# Patient Record
Sex: Female | Born: 1940 | Race: White | Hispanic: No | Smoking: Current every day smoker
Health system: Southern US, Community
[De-identification: ages and names within clinical notes are randomized; demographics above are authoritative.]

## PROBLEM LIST (undated history)

## (undated) DIAGNOSIS — J189 Pneumonia, unspecified organism: Secondary | ICD-10-CM

## (undated) DIAGNOSIS — Z720 Tobacco use: Secondary | ICD-10-CM

## (undated) DIAGNOSIS — Z8701 Personal history of pneumonia (recurrent): Secondary | ICD-10-CM

## (undated) DIAGNOSIS — Z9889 Other specified postprocedural states: Secondary | ICD-10-CM

## (undated) DIAGNOSIS — F419 Anxiety disorder, unspecified: Secondary | ICD-10-CM

## (undated) DIAGNOSIS — R112 Nausea with vomiting, unspecified: Secondary | ICD-10-CM

## (undated) DIAGNOSIS — C50919 Malignant neoplasm of unspecified site of unspecified female breast: Secondary | ICD-10-CM

## (undated) DIAGNOSIS — R911 Solitary pulmonary nodule: Secondary | ICD-10-CM

## (undated) DIAGNOSIS — N200 Calculus of kidney: Secondary | ICD-10-CM

## (undated) HISTORY — DX: Calculus of kidney: N20.0

## (undated) HISTORY — DX: Malignant neoplasm of unspecified site of unspecified female breast: C50.919

## (undated) HISTORY — PX: JOINT REPLACEMENT: SHX530

## (undated) HISTORY — PX: BREAST SURGERY: SHX581

## (undated) HISTORY — DX: Personal history of pneumonia (recurrent): Z87.01

## (undated) HISTORY — DX: Solitary pulmonary nodule: R91.1

## (undated) HISTORY — PX: PARTIAL HYSTERECTOMY: SHX80

## (undated) HISTORY — DX: Tobacco use: Z72.0

## (undated) HISTORY — PX: TUBAL LIGATION: SHX77

---

## 1998-06-14 ENCOUNTER — Ambulatory Visit (HOSPITAL_COMMUNITY): Admission: RE | Admit: 1998-06-14 | Discharge: 1998-06-14 | Payer: Self-pay | Admitting: Otolaryngology

## 1998-07-03 ENCOUNTER — Other Ambulatory Visit: Admission: RE | Admit: 1998-07-03 | Discharge: 1998-07-03 | Payer: Self-pay | Admitting: *Deleted

## 1998-07-12 ENCOUNTER — Emergency Department (HOSPITAL_COMMUNITY): Admission: EM | Admit: 1998-07-12 | Discharge: 1998-07-12 | Payer: Self-pay | Admitting: Emergency Medicine

## 1998-07-12 ENCOUNTER — Encounter: Payer: Self-pay | Admitting: Emergency Medicine

## 1998-10-26 ENCOUNTER — Encounter: Payer: Self-pay | Admitting: Otolaryngology

## 1999-10-22 ENCOUNTER — Other Ambulatory Visit: Admission: RE | Admit: 1999-10-22 | Discharge: 1999-10-22 | Payer: Self-pay | Admitting: *Deleted

## 2000-03-10 ENCOUNTER — Encounter: Payer: Self-pay | Admitting: Emergency Medicine

## 2000-03-10 ENCOUNTER — Emergency Department (HOSPITAL_COMMUNITY): Admission: EM | Admit: 2000-03-10 | Discharge: 2000-03-10 | Payer: Self-pay | Admitting: Emergency Medicine

## 2000-11-11 ENCOUNTER — Other Ambulatory Visit: Admission: RE | Admit: 2000-11-11 | Discharge: 2000-11-11 | Payer: Self-pay | Admitting: *Deleted

## 2001-11-30 ENCOUNTER — Other Ambulatory Visit: Admission: RE | Admit: 2001-11-30 | Discharge: 2001-11-30 | Payer: Self-pay | Admitting: *Deleted

## 2003-01-11 ENCOUNTER — Other Ambulatory Visit: Admission: RE | Admit: 2003-01-11 | Discharge: 2003-01-11 | Payer: Self-pay | Admitting: *Deleted

## 2003-01-20 ENCOUNTER — Encounter: Admission: RE | Admit: 2003-01-20 | Discharge: 2003-01-20 | Payer: Self-pay | Admitting: Orthopedic Surgery

## 2003-01-20 ENCOUNTER — Encounter: Payer: Self-pay | Admitting: Orthopedic Surgery

## 2003-01-26 ENCOUNTER — Ambulatory Visit (HOSPITAL_BASED_OUTPATIENT_CLINIC_OR_DEPARTMENT_OTHER): Admission: RE | Admit: 2003-01-26 | Discharge: 2003-01-27 | Payer: Self-pay | Admitting: Orthopedic Surgery

## 2003-05-04 ENCOUNTER — Ambulatory Visit (HOSPITAL_BASED_OUTPATIENT_CLINIC_OR_DEPARTMENT_OTHER): Admission: RE | Admit: 2003-05-04 | Discharge: 2003-05-04 | Payer: Self-pay | Admitting: Orthopedic Surgery

## 2003-09-21 ENCOUNTER — Ambulatory Visit (HOSPITAL_BASED_OUTPATIENT_CLINIC_OR_DEPARTMENT_OTHER): Admission: RE | Admit: 2003-09-21 | Discharge: 2003-09-21 | Payer: Self-pay | Admitting: Orthopedic Surgery

## 2003-12-07 ENCOUNTER — Ambulatory Visit (HOSPITAL_COMMUNITY): Admission: RE | Admit: 2003-12-07 | Discharge: 2003-12-07 | Payer: Self-pay | Admitting: Internal Medicine

## 2004-01-18 ENCOUNTER — Other Ambulatory Visit: Admission: RE | Admit: 2004-01-18 | Discharge: 2004-01-18 | Payer: Self-pay | Admitting: *Deleted

## 2004-12-06 ENCOUNTER — Ambulatory Visit (HOSPITAL_COMMUNITY): Admission: RE | Admit: 2004-12-06 | Discharge: 2004-12-06 | Payer: Self-pay | Admitting: Internal Medicine

## 2005-06-04 ENCOUNTER — Ambulatory Visit (HOSPITAL_COMMUNITY): Admission: RE | Admit: 2005-06-04 | Discharge: 2005-06-04 | Payer: Self-pay | Admitting: Orthopedic Surgery

## 2005-12-09 ENCOUNTER — Other Ambulatory Visit: Admission: RE | Admit: 2005-12-09 | Discharge: 2005-12-09 | Payer: Self-pay | Admitting: Internal Medicine

## 2006-12-30 ENCOUNTER — Ambulatory Visit (HOSPITAL_COMMUNITY): Admission: RE | Admit: 2006-12-30 | Discharge: 2006-12-30 | Payer: Self-pay | Admitting: Internal Medicine

## 2007-12-27 ENCOUNTER — Other Ambulatory Visit: Admission: RE | Admit: 2007-12-27 | Discharge: 2007-12-27 | Payer: Self-pay | Admitting: Internal Medicine

## 2008-02-03 ENCOUNTER — Ambulatory Visit (HOSPITAL_COMMUNITY): Admission: RE | Admit: 2008-02-03 | Discharge: 2008-02-03 | Payer: Self-pay | Admitting: Internal Medicine

## 2008-07-04 ENCOUNTER — Encounter: Admission: RE | Admit: 2008-07-04 | Discharge: 2008-07-04 | Payer: Self-pay | Admitting: Otolaryngology

## 2009-03-01 ENCOUNTER — Emergency Department (HOSPITAL_COMMUNITY): Admission: EM | Admit: 2009-03-01 | Discharge: 2009-03-01 | Payer: Self-pay | Admitting: Emergency Medicine

## 2009-03-22 ENCOUNTER — Encounter: Payer: Self-pay | Admitting: Internal Medicine

## 2009-05-01 ENCOUNTER — Ambulatory Visit: Payer: Self-pay | Admitting: Internal Medicine

## 2009-05-01 DIAGNOSIS — R911 Solitary pulmonary nodule: Secondary | ICD-10-CM

## 2009-05-01 DIAGNOSIS — N200 Calculus of kidney: Secondary | ICD-10-CM | POA: Insufficient documentation

## 2009-05-01 DIAGNOSIS — F17200 Nicotine dependence, unspecified, uncomplicated: Secondary | ICD-10-CM

## 2009-05-04 ENCOUNTER — Encounter: Payer: Self-pay | Admitting: Internal Medicine

## 2009-05-05 DIAGNOSIS — Z8701 Personal history of pneumonia (recurrent): Secondary | ICD-10-CM | POA: Insufficient documentation

## 2009-05-05 DIAGNOSIS — J329 Chronic sinusitis, unspecified: Secondary | ICD-10-CM | POA: Insufficient documentation

## 2009-05-07 ENCOUNTER — Telehealth: Payer: Self-pay | Admitting: Internal Medicine

## 2009-05-17 ENCOUNTER — Ambulatory Visit: Payer: Self-pay | Admitting: Internal Medicine

## 2009-05-24 ENCOUNTER — Ambulatory Visit: Payer: Self-pay | Admitting: Internal Medicine

## 2009-06-20 ENCOUNTER — Encounter: Payer: Self-pay | Admitting: Internal Medicine

## 2009-10-29 ENCOUNTER — Ambulatory Visit: Payer: Self-pay | Admitting: Internal Medicine

## 2009-10-29 DIAGNOSIS — J40 Bronchitis, not specified as acute or chronic: Secondary | ICD-10-CM

## 2010-02-21 ENCOUNTER — Ambulatory Visit: Payer: Self-pay | Admitting: Internal Medicine

## 2010-06-25 ENCOUNTER — Ambulatory Visit: Payer: Self-pay | Admitting: Internal Medicine

## 2010-10-17 NOTE — Assessment & Plan Note (Signed)
Summary: 3 months/apc   Primary Provider/Referring Provider:  Dossie Arbour  CC:  3 month folllow up visit.  History of Present Illness: 05/23/09- Pulmonary nodule, Tobacco We discussed her CD of CT showing small basilar nodules. She is facing colonoscopy for recurrent right upper quadrant right flank pain. She remains frank about being a smoker x 50 years, and we discussed usual issues of tracking nodules: typically for 2 years; detail from CT vs radiation/ cost benefits of CXR.  October 29, 2009- Pulmonary nodule, Tobacco  PFT in Sept had shown mild obstruction. "Sick". She made it over a year without a sinus infection, but started one on Jan 20. Green and yellow. She increased antibiotic, Z pak and mucinex. Used neosynephrine. Less blockage/ pressure now. Pouring rhinorhea. Light headed. Chest congestion and dyspnea. Hasn't been to her ENT this time. Prednisone helped before. Caring for 20 yo mother. Doesn't get flu shot. Says she gets chills when sick, but never runs fever. Throat has been sore and glands swollen, but no nausea or mucscle aches.  February 21, 2010- Pulmonary nodule, tobacco Stressed by responsibility for mother's care. She feels used to this hot summer weather but admits it is hard on her breathing. Back pain- has appt w/ Dr Eulah Pont next week. Hx of 3 lung nodules on Urologic CT 04/2009 ( 1 RML, 2 RLL). We discussed radiation, cost and information available from CT vs CXR. We agreed to check CXr. She was concerned about the amount of radiation she has had from her other medical problems. She has not atempted to stop smoking and is not inclined to do so.  Preventive Screening-Counseling & Management  Alcohol-Tobacco     Smoking Status: current-53 years     Packs/Day: 2.0     Pack years: >50     Tobacco Counseling: to quit use of tobacco products  Current Medications (verified): 1)  Estrace 1 Mg Tabs (Estradiol) .... Take 1 Tablet By Mouth Once A Day 2)  Medroxyprogesterone  Acetate 2.5 Mg Tabs (Medroxyprogesterone Acetate) .... Take 1 Tablet By Mouth Once A Day 3)  Astelin 137 Mcg/spray Soln (Azelastine Hcl) .... As Directed 4)  Nasonex 50 Mcg/act Susp (Mometasone Furoate) .... 2 Sprays in Each Nostril Daily 5)  Tetracycline Hcl 500 Mg Caps (Tetracycline Hcl) .... Take 1 Tablet By Mouth Once A Day 6)  Zaleplon 10 Mg Caps (Zaleplon) .... Take 1 Tablet By Mouth Once A Day 7)  Finacea 15 % Gel (Azelaic Acid) .... As Directed 8)  Sudafed 30 Mg Tabs (Pseudoephedrine Hcl) .... As Directed 9)  Multivitamins  Tabs (Multiple Vitamin) .... Take 1 Tablet By Mouth Once A Day 10)  Vitamin D 1000 Unit Tabs (Cholecalciferol) .... Take 3 By Mouth Once Daily 11)  Vitamin C 500 Mg Tabs (Ascorbic Acid) .... Take 1 Tablet By Mouth Once A Day 12)  Acidophilus  Caps (Lactobacillus) .... Take 1 Tablet By Mouth Once A Day 13)  Lysine Hcl 1000 Mg Tabs (Lysine Hcl) .... Take 1 Tablet By Mouth Once A Day 14)  Fish Oil 1000 Mg Caps (Omega-3 Fatty Acids) .... Take 1 Tablet By Mouth Once A Day 15)  Clemestine Fumarate 1.34 Mg .... Take 1/2 To 1 Tablet Daily 16)  Epa 1600 .... Take 1 Tablet By Mouth Once A Day 17)  Dha 800 .... Take 1 Tablet By Mouth Once A Day 18)  Sesamin 60 .... Take 1 Tablet By Mouth Once A Day  Allergies (verified): 1)  ! Pcn 2)  !  Sulfa 3)  ! 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 -2 ppd  Risk Factors: Smoking Status: current-53 years  (02/21/2010) Packs/Day: 2.0 (02/21/2010)  Family History: Cancer Mother- ill, "failure to thrive", Hospice  Social History: Packs/Day:  2.0 Smoking Status:  current-53 years Pack years:  >50  Review of Systems      See HPI  The patient denies shortness of breath with activity, shortness of breath at rest, productive cough, non-productive cough, coughing up blood, chest pain, irregular heartbeats, acid heartburn, indigestion, loss of appetite, weight change, abdominal pain, difficulty swallowing, sore throat, tooth/dental problems, headaches, nasal congestion/difficulty breathing through nose, sneezing, itching, ear ache, anxiety, depression, hand/feet swelling, joint stiffness or pain, rash, change in color of mucus, and fever.    Vital Signs:  Patient profile:   70 year old female Height:      66 inches Weight:      140 pounds BMI:     22.68 O2 Sat:      95 % on Room air Pulse rate:   78 / minute BP sitting:   116 / 68  (left arm) Cuff size:   regular  Vitals Entered By: Reynaldo Minium CMA (February 21, 2010 3:07 PM)  O2 Flow:  Room air CC: 3 month folllow up visit   Physical Exam  Additional Exam:  General: A/Ox3; pleasant and cooperative, NAD, tall,  SKIN: no rash, lesions NODES: no lymphadenopathy HEENT: [place]/AT, EOM- WNL, Conjuctivae- clear, PERRLA, TM-WNL, Nose- clear, Throat- clear and wnl, Mallampatii II, no erythema or drip NECK: Supple w/ fair ROM, JVD- none, normal carotid impulses w/o bruits Thyroid-  CHEST: clear to P&A HEART: RRR, no m/g/r heard ABDOMEN: Soft and nl; AOZ:HYQM, nl pulses, no edema  NEURO: Grossly intact to observation      Impression & Recommendations:  Problem # 1:  PULMONARY NODULE (ICD-518.89)  We will update CXR, with comparison of imaging technologies discussed. We agreed to get CXR this time.  Problem # 2:  BRONCHITIS (ICD-490)  Asymptomatic now. She notes litle cough or phlegm. The following medications were removed from the  medication list:    Mucinex Dm 30-600 Mg Xr12h-tab (Dextromethorphan-guaifenesin) .Marland Kitchen... Take two times a day    Clarithromycin 500 Mg Tabs (Clarithromycin) .Marland Kitchen... 1 twice daily after meals Her updated medication list for this problem includes:    Tetracycline Hcl 500 Mg Caps (Tetracycline hcl) .Marland Kitchen... Take 1 tablet by mouth once a day  Problem # 3:  TOBACCO ABUSE (ICD-305.1)  Continuing counseling  Orders: Est. Patient Level III (57846) T-2 View CXR (71020TC)  Medications Added to Medication List This Visit: 1)  Vitamin D 1000 Unit Tabs (Cholecalciferol) .... Take 3 by mouth once daily  Patient Instructions: 1)  Please schedule a follow-up appointment in 4 months. 2)  A chest x-ray has been recommended.  Your imaging study may require preauthorization.  3)  Please try to minimize the smoking where you can

## 2010-10-17 NOTE — Assessment & Plan Note (Signed)
Summary: 4 month/ mbw   Primary Pollie Poma/Referring Daviyon Widmayer:  Dossie Arbour  CC:  4 month follow up visit-yesterday "fall issues" and increased meds to help.Marland Kitchen  History of Present Illness: October 29, 2009- Pulmonary nodule, Tobacco  PFT in Sept had shown mild obstruction. "Sick". She made it over a year without a sinus infection, but started one on Jan 20. Green and yellow. She increased antibiotic, Z pak and mucinex. Used neosynephrine. Less blockage/ pressure now. Pouring rhinorhea. Light headed. Chest congestion and dyspnea. Hasn't been to her ENT this time. Prednisone helped before. Caring for 64 yo mother. Doesn't get flu shot. Says she gets chills when sick, but never runs fever. Throat has been sore and glands swollen, but no nausea or mucscle aches.  February 21, 2010- Pulmonary nodule, tobacco Stressed by responsibility for mother's care. She feels used to this hot summer weather but admits it is hard on her breathing. Back pain- has appt w/ Dr Eulah Pont next week. Hx of 3 lung nodules on Urologic CT 04/2009 ( 1 RML, 2 RLL). We discussed radiation, cost and information available from CT vs CXR. We agreed to check CXr. She was concerned about the amount of radiation she has had from her other medical problems. She has not atempted to stop smoking and is not inclined to do so.  June 25, 2010- Pulmonary nodule, tobacco cc:4 month follow up visit-yesterday "fall issues" and increased meds to help. Continues to smoke.  Feels she is "on the edge" to coming down with something. Abrupt onset yesterday of head congestion and postnasal drip. she increased Sudafed and clemastine. Today feels much better. She feels the risk factor is dry indoor heat. She uses humidifers. Doesn't like Neti pot. She holds antibiotic and some prednisone. She is on Vit D. Discussed zinc.  CXR- 02/21/10- COPD, NAD.    Preventive Screening-Counseling & Management  Alcohol-Tobacco     Smoking Status: current-53 years  Packs/Day: 2.0     Pack years: >50     Tobacco Counseling: to quit use of tobacco products  Current Medications (verified): 1)  Estrace 1 Mg Tabs (Estradiol) .... Take 1 Tablet By Mouth Once A Day 2)  Medroxyprogesterone Acetate 2.5 Mg Tabs (Medroxyprogesterone Acetate) .... Take 1 Tablet By Mouth Once A Day 3)  Astelin 137 Mcg/spray Soln (Azelastine Hcl) .... As Directed 4)  Nasonex 50 Mcg/act Susp (Mometasone Furoate) .... 2 Sprays in Each Nostril Daily 5)  Tetracycline Hcl 500 Mg Caps (Tetracycline Hcl) .... Take 1 Tablet By Mouth Once A Day 6)  Zaleplon 10 Mg Caps (Zaleplon) .... Take 1 Tablet By Mouth Once A Day 7)  Finacea 15 % Gel (Azelaic Acid) .... As Directed 8)  Sudafed 30 Mg Tabs (Pseudoephedrine Hcl) .... As Directed 9)  Multivitamins  Tabs (Multiple Vitamin) .... Take 1 Tablet By Mouth Once A Day 10)  Vitamin D 1000 Unit Tabs (Cholecalciferol) .... Take 3 By Mouth Once Daily 11)  Vitamin C 500 Mg Tabs (Ascorbic Acid) .... Take 1 Tablet By Mouth Once A Day 12)  Acidophilus  Caps (Lactobacillus) .... Take 1 Tablet By Mouth Once A Day 13)  Lysine Hcl 1000 Mg Tabs (Lysine Hcl) .... Take 1 Tablet By Mouth Once A Day 14)  Clemestine Fumarate 1.34 Mg .... Take 1/2 To 1 Tablet Daily 15)  Epa 1600 .... Take 1 Tablet By Mouth Once A Day 16)  Dha 800 .... Take 1 Tablet By Mouth Once A Day 17)  Sesamin 60 .... Take  1 Tablet By Mouth Once A Day  Allergies (verified): 1)  ! Pcn 2)  ! Sulfa 3)  ! Codeine  Past History:  Past Medical History: Last updated: 05/01/2009 PULMONARY NODULE (ICD-518.89) SINUSITIS, CHRONIC (ICD-473.9) PERSONAL HISTORY PNEUMONIA RECURRENT (ICD-V12.61) TOBACCO ABUSE (ICD-305.1) NEPHROLITHIASIS (ICD-592.0)  Past Surgical History: Last updated: 05/01/2009 partial hysterectomy tubal ligation  Family History: Last updated: 02/21/2010 Cancer Mother- ill, "failure to thrive", Hospice  Social History: Last updated: 05/01/2009 Widowed, living alone Son  is anesthesiologist in [place] Patient is a current smoker. -2 ppd  Risk Factors: Smoking Status: current-53 years (06/25/2010) Packs/Day: 2.0 (06/25/2010)  Review of Systems      See HPI       The patient complains of nasal congestion/difficulty breathing through nose.  The patient denies shortness of breath with activity, shortness of breath at rest, productive cough, non-productive cough, coughing up blood, chest pain, irregular heartbeats, acid heartburn, indigestion, loss of appetite, weight change, abdominal pain, difficulty swallowing, tooth/dental problems, headaches, and sneezing.    Vital Signs:  Patient profile:   70 year old female Height:      66 inches Weight:      141 pounds BMI:     22.84 O2 Sat:      97 % on Room air Pulse rate:   90 / minute BP sitting:   122 / 74  (right arm) Cuff size:   regular  Vitals Entered By: Reynaldo Minium CMA (June 25, 2010 4:19 PM)  O2 Flow:  Room air CC: 4 month follow up visit-yesterday "fall issues" and increased meds to help.   Physical Exam  Additional Exam:  General: A/Ox3; pleasant and cooperative, NAD, tall,  SKIN: no rash, lesions NODES: no lymphadenopathy HEENT: [place]/AT, EOM- WNL, Conjuctivae- clear, PERRLA, TM-WNL, Nose- clear, Throat- clear and wnl, Mallampatii II, no erythema or drip NECK: Supple w/ fair ROM, JVD- none, normal carotid impulses w/o bruits Thyroid-  CHEST: clear to P&A HEART: RRR, no m/g/r heard ABDOMEN: Soft and nl; YNW:GNFA, nl pulses, no edema  NEURO: Grossly intact to observation      CXR  Procedure date:  02/21/2010  Findings:      DG CHEST 2 VIEW - [id number]   Clinical Data: Followup lung nodule, history of tobacco abuse   CHEST - 2 VIEW   Comparison: Chest x-ray of 05/01/2009   Findings: The lungs are hyperaerated consistent with COPD.  No definite lung nodule is noted by plain film.  Correlation with prior CT is recommended.  Mediastinal contours are stable.  The heart is  within normal limits in size.  No bony abnormality is seen.   IMPRESSION: COPD.  No active lung disease.   Read By:  Juline Patch,  M.D.     Released By:  Juline Patch,  M.D.  _______  Impression & Recommendations:  Problem # 1:  BRONCHITIS (ICD-490)  Her concern is that URIs quickly progress. Today she feel well. We discussed prophyllaxis, hydration and etc and will give the standby meds as needed. Discussed flu vax, which she doesn't take since strong flu response 40 years ago.  Her updated medication list for this problem includes:    Tetracycline Hcl 500 Mg Caps (Tetracycline hcl) .Marland Kitchen... Take 1 tablet by mouth once a day    Clarithromycin 500 Mg Tabs (Clarithromycin) .Marland Kitchen... 1 two times a day with food  Problem # 2:  PULMONARY NODULE (ICD-518.89) CXR surveillance so far is unchanged.   Problem # 3:  TOBACCO  ABUSE (ICD-305.1) She is not prepared to stop smoking despite my supportive efforts.  Medications Added to Medication List This Visit: 1)  Prednisone 10 Mg Tabs (Prednisone) .Marland Kitchen.. 1 tab four times daily x 2 days, 3 times daily x 2 days, 2 times daily x 2 days, 1 time daily x 2 days 2)  Clarithromycin 500 Mg Tabs (Clarithromycin) .Marland Kitchen.. 1 two times a day with food  Other Orders: Est. Patient Level III (75102)  Patient Instructions: 1)  Please schedule a follow-up appointment in 1 year. Call sooner if needed. 2)  Yes Ma'am, I still recommend flu shots and smoking cesation as good general strategies for a long and healthy life.  Prescriptions: CLARITHROMYCIN 500 MG TABS (CLARITHROMYCIN) 1 two times a day with food  #14 x 2   Entered and Authorized by:   Waymon Budge MD   Signed by:   Waymon Budge MD on 06/25/2010   Method used:   Historical   RxID:   [id number] PREDNISONE 10 MG TABS (PREDNISONE) 1 tab four times daily x 2 days, 3 times daily x 2 days, 2 times daily x 2 days, 1 time daily x 2 days  #20 x 2   Entered and Authorized by:   Waymon Budge MD    Signed by:   Waymon Budge MD on 06/25/2010   Method used:   Historical   RxID:   [id number]      CXR  Procedure date:  02/21/2010  Findings:      DG CHEST 2 VIEW - [id number]   Clinical Data: Followup lung nodule, history of tobacco abuse   CHEST - 2 VIEW   Comparison: Chest x-ray of 05/01/2009   Findings: The lungs are hyperaerated consistent with COPD.  No definite lung nodule is noted by plain film.  Correlation with prior CT is recommended.  Mediastinal contours are stable.  The heart is within normal limits in size.  No bony abnormality is seen.   IMPRESSION: COPD.  No active lung disease.   Read By:  Juline Patch,  M.D.     Released By:  Juline Patch,  M.D.  _______

## 2010-10-17 NOTE — Assessment & Plan Note (Signed)
Summary: rov 4 months///kp   Primary Provider/Referring Provider:  Dossie Arbour  CC:  Follow up visit-sinus inflammed.[place]  History of Present Illness:  05/01/09- 70 yoF 2 ppd smoker referred courtesy of Dr Dossie Arbour for evaluation of small lung nodules on CT. She had w/u including ureteral stent at [facility] for kidney stone. CT scan of abdomen and pelvis in May and again in July of this year showed 2 small incidental noduyles in the posterior right lung base, measuring approx 5 mm. She gives a hx of recurrent sinusitis and pneumonias, especially in the first years after moving here from [place] in 1997. She denies routine cough, phlegm or chest pain, but does note chronic postnasal drip and may need sinus surgery. last pneumonia was 3-4 years ago. Has had pneumovax. Denies exposure to TB or regional granulomatous/ fungal infections.  She states that she 'will not do Neti Pot".  05/23/09- Pulmonary nodule, Tobacco We discussed her CD of CT showing small basilar nodules. She is facing colonoscopy for recurrent right upper quadrant right flank pain. She remains frank about being a smoker x 50 years, and we discussed usual issues of tracking nodules: typically for 2 years; detail from CT vs radiation/ cost benefits of CXR.  October 29, 2009- Pulmnary nodule, Tobacco  PFT in Sept had shown mild obstruction. "Sick". She made it over a year without a sinus infection, but started one on Jan 20. Green and yellow. She increased antibiotic, Z pak and mucinex. Used neosynephrine. Less blockage/ pressure now. Pouring rhinorhea. Light headed. Chest congestion and dyspnea. Hasn't been to her ENT this time. Prednisone helped before. Caring for 70 yo mother. Doesn't get flu shot. Says she gets chills when sick, but never runs fever. Throat has been sore and glands swollen, but no nausea or mucscle aches.  Current Medications (verified): 1)  Estrace 1 Mg Tabs (Estradiol) .... Take 1 Tablet By Mouth Once A  Day 2)  Medroxyprogesterone Acetate 2.5 Mg Tabs (Medroxyprogesterone Acetate) .... Take 1 Tablet By Mouth Once A Day 3)  Astelin 137 Mcg/spray Soln (Azelastine Hcl) .... As Directed 4)  Nasonex 50 Mcg/act Susp (Mometasone Furoate) .... 2 Sprays in Each Nostril Daily 5)  Boniva 150 Mg Tabs (Ibandronate Sodium) .... Once A Month 6)  Tetracycline Hcl 500 Mg Caps (Tetracycline Hcl) .... Take 1 Tablet By Mouth Once A Day 7)  Zaleplon 10 Mg Caps (Zaleplon) .... Take 1 Tablet By Mouth Once A Day 8)  Finacea 15 % Gel (Azelaic Acid) .... As Directed 9)  Sudafed 30 Mg Tabs (Pseudoephedrine Hcl) .... As Directed 10)  Multivitamins  Tabs (Multiple Vitamin) .... Take 1 Tablet By Mouth Once A Day 11)  Calcium 600 1500 Mg Tabs (Calcium Carbonate) .... Take 1 Tablet By Mouth Once A Day 12)  Vitamin D 2000 Unit Tabs (Cholecalciferol) .... Take 1 Tablet By Mouth Once A Day 13)  Vitamin C 500 Mg Tabs (Ascorbic Acid) .... Take 1 Tablet By Mouth Once A Day 14)  Acidophilus  Caps (Lactobacillus) .... Take 1 Tablet By Mouth Once A Day 15)  Lysine Hcl 1000 Mg Tabs (Lysine Hcl) .... Take 1 Tablet By Mouth Once A Day 16)  Fish Oil 1000 Mg Caps (Omega-3 Fatty Acids) .... Take 1 Tablet By Mouth Once A Day 17)  Clemestine Fumarate 1.34 Mg .... Take 1/2 To 1 Tablet Daily 18)  Epa 1600 .... Take 1 Tablet By Mouth Once A Day 19)  Dha 800 .... Take 1 Tablet By  Mouth Once A Day 20)  Sesamin 60 .... Take 1 Tablet By Mouth Once A Day 21)  Mucinex Dm 30-600 Mg Xr12h-Tab (Dextromethorphan-Guaifenesin) .... Take Two Times A Day  Allergies (verified): 1)  ! Pcn 2)  ! Sulfa 3)  ! 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 -2 ppd  Risk Factors: Smoking Status: current (05/01/2009)  Review of Systems      See HPI       The patient complains of dyspnea on exertion, prolonged cough, and headaches.  The patient denies anorexia, fever, weight loss, weight gain, vision loss, decreased hearing, hoarseness, chest pain, syncope, peripheral edema, hemoptysis, abdominal pain, and severe indigestion/heartburn.    Vital Signs:  Patient profile:   70 year old female Height:      66 inches Weight:      144 pounds BMI:     23.33 O2 Sat:      97 % on Room air Pulse rate:   82 / minute BP sitting:   118 / 70  (left arm) Cuff size:   regular  Vitals Entered By: Reynaldo Minium CMA (October 29, 2009 2:18 PM)  O2 Flow:  Room air  Physical Exam  Additional Exam:  General: A/Ox3; pleasant and cooperative, NAD, tall,  SKIN: no rash, lesions NODES: no lymphadenopathy HEENT: [place]/AT, EOM- WNL, Conjuctivae- clear, PERRLA, TM-WNL, Nose- clear, Throat- clear and wnl, Melampatti II, no erythema or drip NECK: Supple w/ fair ROM, JVD- none, normal carotid impulses w/o bruits Thyroid-  CHEST: barking cough HEART: RRR, no m/g/r heard ABDOMEN: Soft and nl; WJX:BJYN, nl pulses, no edema  NEURO: Grossly intact to observation      Impression & Recommendations:  Problem # 1:  SINUSITIS, CHRONIC (ICD-473.9) Acute exacerbation of rhinosinusitis. She believes infection is largely resolved, despite continued productive cough with green. I will give her the prednisone taper she feels will help, but also as stand by antibiotic script.  Problem # 2:  BRONCHITIS (ICD-490) Developing an acute bronchitis now. Antibiotic and prednisone are appropriate. Her updated medication list for this problem includes:    Tetracycline Hcl 500 Mg Caps (Tetracycline hcl) .[place]...  Take 1 tablet by mouth once a day    Mucinex Dm 30-600 Mg Xr12h-tab (Dextromethorphan-guaifenesin) .[place]... Take two times a day    Clarithromycin 500 Mg Tabs (Clarithromycin) .[place]... 1 twice daily after meals  Problem # 3:  TOBACCO ABUSE (ICD-305.1)  She is continuing to blame change in climate from the warm humidity of [place], instead of her ongoing smoking.  Orders: Est. Patient Level III (82956)  Medications Added to Medication List This Visit: 1)  Mucinex Dm 30-600 Mg Xr12h-tab (Dextromethorphan-guaifenesin) .... Take two times a day 2)  Prednisone 10 Mg Tabs (Prednisone) .[place].. 1 tab four times daily x 2 days, 3 times daily x 2 days, 2 times daily x 2 days, 1 time daily x 2 days 3)  Clarithromycin 500 Mg Tabs (Clarithromycin) .[place].. 1 twice daily after meals  Patient Instructions: 1)  Please schedule a follow-up appointment in 3 months. 2)  Scripts for prednisone and antibiotic sent to your drug store 3)  Extra fluids, Mucinex and Sudafed-PE  may help with sinus congestion and mucus clearance. Prescriptions: CLARITHROMYCIN 500 MG TABS (CLARITHROMYCIN) 1 twice daily after meals  #14 x 1   Entered and Authorized by:   Waymon Budge MD   Signed by:   Waymon Budge MD on 10/29/2009   Method used:   Electronically to        [facility] [address] [phone]* (retail)       [address]       [place], [place]  [address]       Ph: [phone] or [phone]       Fax: [phone]   RxID:   [id number] PREDNISONE 10 MG TABS (PREDNISONE) 1 tab four times daily x 2 days, 3 times daily x 2 days, 2 times daily x 2 days, 1 time daily x 2 days  #20 x 0   Entered and Authorized by:   Waymon Budge MD   Signed by:   Waymon Budge MD on 10/29/2009   Method used:   [facility] to        [facility] [address] [phone]* (retail)       [address]       [place], [place]  [address]       Ph: [phone] or [phone]       Fax: [phone]    RxID:   [id number]

## 2010-12-23 LAB — DIFFERENTIAL
Basophils Relative: 0 % (ref 0–1)
Eosinophils Relative: 1 % (ref 0–5)
Monocytes Absolute: 0.6 10*3/uL (ref 0.1–1.0)
Monocytes Relative: 5 % (ref 3–12)
Neutro Abs: 10.4 10*3/uL — ABNORMAL HIGH (ref 1.7–7.7)

## 2010-12-23 LAB — URINALYSIS, ROUTINE W REFLEX MICROSCOPIC
Nitrite: NEGATIVE
Protein, ur: 100 mg/dL — AB
Specific Gravity, Urine: 1.02 (ref 1.005–1.030)
Urobilinogen, UA: 1 mg/dL (ref 0.0–1.0)

## 2010-12-23 LAB — BASIC METABOLIC PANEL
BUN: 11 mg/dL (ref 6–23)
Creatinine, Ser: 0.92 mg/dL (ref 0.4–1.2)
GFR calc Af Amer: >60 mL/min (ref >60)
GFR calc non Af Amer: >60 mL/min (ref >60)
Potassium: 4.5 mEq/L (ref 3.5–5.1)

## 2010-12-23 LAB — URINE CULTURE: Culture: NO GROWTH

## 2010-12-23 LAB — CBC
HCT: 44.3 % (ref 36.0–46.0)
Hemoglobin: 15.5 g/dL — ABNORMAL HIGH (ref 12.0–15.0)
MCHC: 34.9 g/dL (ref 30.0–36.0)
RBC: 4.58 MIL/uL (ref 3.87–5.11)

## 2010-12-23 LAB — URINE MICROSCOPIC-ADD ON

## 2011-01-31 NOTE — Op Note (Signed)
NAME:  Crystal Hill, Crystal Hill                         ACCOUNT NO.:  [id number]   MEDICAL RECORD NO.:  [id number]                   PATIENT TYPE:  AMB   LOCATION:  DSC                                  FACILITY:  [facility]   PHYSICIAN:  Loreta Ave, M.D.              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   PROCEDURE:  Left shoulder exam under anesthesia, arthroscopy, debridement of  rotator cuff and labrum.  Acromioplasty with coracoacromial ligament  release.  Excision distal clavicle.  Open repair rotator cuff tear with  FiberWire suture and Concert repair system.   SURGEON:  Loreta Ave, M.D.   ASSISTANT:  Arlys John D. Petrarca, P.A.-C.   ANESTHESIA:  General.   ESTIMATED BLOOD LOSS:  Minimal.   SPECIMENS:  None.   CULTURES:  None.   COMPLICATIONS:  None.   DRESSINGS:  Soft compressive with shoulder immobilizer.   PROCEDURE:  The patient was brought to the operating room and placed on the  operating table in supine position.  After adequate anesthesia had been  obtained, the left shoulder was examined.  Full motion with good stability.  She was placed in a beach chair position on the shoulder positioner, prepped  and draped in the usual sterile fashion.  Three standard portals, anterior,  posterior, and lateral portals created.  The shoulder entered with a blunt  obturator, distended, and inspected.  Complete attritional tearing  supraspinatus tendon from the front to the back.  Minimal retraction.  A lot  of tendinous tearing laterally.  All this  debrided down to good tissue.  Complex tear anterior labrum debrided.  Remaining articular cartilage,  biceps tendon, biceps anchor, capsule, and ligamentous structures intact.  The cannula was redirected subacromially.  Confirmation full thickness tear  with a lot of attrition on the top of the tendon, as well.  All was  debrided.  Reasonable tissue quality except for the area of the tear.  Type  2-3 acromion.  Bursa resected and cuff debrided.  Acromioplasty to a type 1  acromion with shaver and high speed bur.  CA ligament released with cautery.  Distal clavicle grade 3-4 chondromalacia.  Lateral cm resected.  Adequacy of  decompression and clavicle excision confirmed viewing from both portals.  Instruments and fluid removed.  A deltoid splitting incision through the  lateral portal exposing the subacromial space.  The cuff debrided back to  healthy tissue with the knife.  Humerus roughened at the attachment site.  The cuff was then repaired with two FiberWire sutures weaved well medially  into the  cuff and mobilizing it for repair.  The sutures were brought  through bony tunnels and then firmly tied over a bony bridge yielding a  nice, firm, watertight closure of the cuff without undue tension even with  full range of motion.  The adequacy of decompression confirmed digitally at  the time of cuff repair.  The wound was thoroughly irrigated.  The deltoid  was closed with Vicryl.  The skin and subcutaneous tissues were closed with  Vicryl.  The portals were closed with nylon.  The margins of the wound  injected with Marcaine.  A sterile compressive dressing applied.  Shoulder  immobilizer applied.  Anesthesia reversed.  Brought to the recovery room.  Tolerated the surgery well without complications.                                               Loreta Ave, M.D.    DFM/MEDQ  D:  09/21/2003  T:  09/21/2003  Job:  [id number]

## 2011-01-31 NOTE — Op Note (Signed)
NAME:  Crystal Hill, Crystal Hill                         ACCOUNT NO.:  [id number]   MEDICAL RECORD NO.:  [id number]                   PATIENT TYPE:  AMB   LOCATION:  DSC                                  FACILITY:  [facility]   PHYSICIAN:  Loreta Ave, M.D.              DATE OF BIRTH:  1941/01/24   DATE OF PROCEDURE:  01/26/2003  DATE OF DISCHARGE:                                 OPERATIVE REPORT   PREOPERATIVE DIAGNOSES:  1. Chronic impingement, right shoulder, with partial-thickness rotator cuff     tear and acromioclavicular joint degenerative arthritis.  2. Impingement, left shoulder.   POSTOPERATIVE DIAGNOSES:  1. Right shoulder chronic impingement with full-thickness tearing,     supraspinatus tendon, degenerative joint disease, acromioclavicular     joint.  2. Impingement, left shoulder.   PROCEDURES:  1. Right shoulder examination under anesthesia, arthroscopy, with     debridement of labral tear, assessment of the rotator cuff,     acromioplasty, coracoacromial ligament release, excision of distal     clavicle, and open rotator cuff tear.  2. Subacromial injection, left shoulder.   SURGEON:  Loreta Ave, M.D.   ASSISTANT:  Arlys John D. Petrarca, P.A.-C.   ANESTHESIA:  General.   ESTIMATED BLOOD LOSS:  Minimal.   SPECIMENS:  None.   CULTURES:  None.   COMPLICATIONS:  None.   DRESSING:  Soft compressive with shoulder immobilizer on the right.   DESCRIPTION OF PROCEDURE:  Patient brought to the operating room and after  adequate anesthesia had been obtained, both shoulders were examined.  Both  had full motion and good stability.  Under sterile technique the left  shoulder was injected subacromially with Depo-Medrol and Marcaine without  difficulty.  Attention was turned to the right shoulder.  Placed in a beach  chair position on the shoulder positioner, prepped and draped in the usual  sterile fashion.  Three portals, anterior, posterior, and lateral.  Shoulder  entered with a blunt obturator, distended, and inspected.  Significant  tearing, anterior labrum, debrided with shaver.  Biceps tendon, biceps  anchor, capsular and ligamentous structures, and articular cartilage intact.  A full-thickness tear through the supraspinatus tendon over near the lateral  attachment right in the midportion of the crescent region of the tendon.  Debrided but full thickness tear confirmed.  Very repairable.  Cannula  redirected subacromially.  Type 3 acromion.  Chronic impingement.  Full-  thickness tear recognized.  Grade 4 changes AC joint.  Bursa resected, cuff  debrided, acromioplasty converted the acromion from a type 3 to a type 1  acromion with shaver and high-speed bur.  CA ligament released, hemostasis  with cautery.  Distal clavicle with grade 4 changes.  Lateral centimeter  resected.  Adequacy of decompression confirmed viewing from all portals.  Instruments and fluid removed.  A deltoid-splitting incision through the  lateral portal.  Subacromial space assessed.  Adequacy of decompression  confirmed.  Irrigated.  The cuff was then mobilized and debrided back to  healthy tissue.  The main portion of the tear was captured with Fibrewire  suture and repaired down to the humerus through drill holes with the  Fibrewire repair system and Fibrewire suture.  Interval tearing in front and  in back of this was then repaired with #2 Ethibond suture.  The tear was  felt to have been an attritional erosion through the cuff so when I brought  the cuff over to repair it there was still some thin tissue, and I brought  portions of the cuff around the front and the back over top of this with  Ethibond to reinforce that area.  At completion a nice, firm, watertight  closure with good cuff repair throughout.  Wound irrigated.  Deltoid closed  with Vicryl.  Skin and subcutaneous tissue with Vicryl.  Margins of the  wound injected with Marcaine.  Portals closed with nylon and  injected with  Marcaine.  A sterile compressive dressing and shoulder immobilizer applied.  Anesthesia reversed.  Brought to the recovery room.  Tolerated the surgery  well with no complications.                                               Loreta Ave, M.D.    DFM/MEDQ  D:  01/26/2003  T:  01/27/2003  Job:  [id number]

## 2011-01-31 NOTE — Op Note (Signed)
NAME:  Crystal Hill, Crystal Hill                         ACCOUNT NO.:  [id number]   MEDICAL RECORD NO.:  [id number]                   PATIENT TYPE:  AMB   LOCATION:  DSC                                  FACILITY:  [facility]   PHYSICIAN:  Loreta Ave, M.D.              DATE OF BIRTH:  Feb 04, 1941   DATE OF PROCEDURE:  05/04/2003  DATE OF DISCHARGE:                                 OPERATIVE REPORT   PREOPERATIVE DIAGNOSIS:  Recurrent tear, rotator cuff, right shoulder.   POSTOPERATIVE DIAGNOSIS:  Recurrent tear, rotator cuff, right shoulder.   OPERATIVE PROCEDURE:  1. Right shoulder examination under anesthesia with diagnostic arthroscopy     and debridement of rotator cuff.  2. Open repair, recurrent tear, with Fibrewire suture.   SURGEON:  Loreta Ave, M.D.   ASSISTANT:  Arlys John D. Petrarca, P.A.-C.   ANESTHESIA:  General.   ESTIMATED BLOOD LOSS:  Minimal.   SPECIMENS:  None.   CULTURES:  None.   COMPLICATIONS:  None.   DRESSING:  Soft compressive with shoulder immobilizer.   DESCRIPTION OF PROCEDURE:  Patient brought to the operating room and after  adequate anesthesia had been obtained placed in a beach chair position out  on the shoulder positioner.  Prepped and draped with Hibiclens in the usual  sterile fashion.  Two portals created, one posterior, one lateral.  Shoulder  entered with a blunt obturator, distended, and inspected.  Fibrewire suture  from previous repair still intact.  Tearing of the supraspinatus around the  suture in the midportion and in front of the repair anteriorly.  Full  thickness, minimally retracted.  The flaps on the undersurface and cuff  debrided on the inside.  Cannula redirected subacromially.  The recurrent  tear confirmed.  Other than that, the subacromial space looked excellent  with nice, smooth cuff and no impingement after previous acromioplasty.  Instruments and fluid removed.  Lateral portal opened up into a deltoid-  splitting  incision exposing the subacromial space.  The cuff tear confirmed.  The cuff was debrided back to healthy tissue, excising the previous sutures  and taking out the thinned recurrent tear throughout the supraspinatus  laterally.  A new trough created in the humerus.  The cuff was then well-  captured with Fibrewire sutures well-weaved medially into the cuff itself.  These were then brought through drill holes in the humerus with the Concept  repair system, firmly tied down.  A nice, firm, watertight closure once  again achieved.  Again very reasonable tissue to achieve this.  Adequacy of  decompression done previously was confirmed again visually.  Able to  progress through full passive motion without undue tension on the cuff.  The  wound irrigated.  Deltoid closed with Vicryl.  Skin and subcutaneous tissue  with  Vicryl subcutaneous, subcuticular.  Portals closed with nylon.  Margins of  the wound injected with Marcaine.  A sterile  compressive dressing applied  with an ABD pad under the arm.  A shoulder immobilizer applied.  Anesthesia  reversed.  Brought to the recovery room.  Tolerated the surgery well with no  complications.                                               Loreta Ave, M.D.    DFM/MEDQ  D:  05/04/2003  T:  05/05/2003  Job:  [id number]

## 2011-03-21 ENCOUNTER — Emergency Department (HOSPITAL_COMMUNITY): Payer: Medicare Other

## 2011-03-21 ENCOUNTER — Emergency Department (HOSPITAL_COMMUNITY)
Admission: EM | Admit: 2011-03-21 | Discharge: 2011-03-21 | Discharge status: Against Medical Advice | Payer: Medicare Other | Attending: Emergency Medicine | Admitting: Emergency Medicine

## 2011-03-21 DIAGNOSIS — R109 Unspecified abdominal pain: Secondary | ICD-10-CM | POA: Insufficient documentation

## 2011-03-21 DIAGNOSIS — H532 Diplopia: Secondary | ICD-10-CM | POA: Insufficient documentation

## 2011-03-21 DIAGNOSIS — Z79899 Other long term (current) drug therapy: Secondary | ICD-10-CM | POA: Insufficient documentation

## 2011-03-21 DIAGNOSIS — R51 Headache: Secondary | ICD-10-CM | POA: Insufficient documentation

## 2011-03-21 DIAGNOSIS — R42 Dizziness and giddiness: Secondary | ICD-10-CM | POA: Insufficient documentation

## 2011-03-21 LAB — CBC
HCT: 41.9 % (ref 36.0–46.0)
MCH: 34.3 pg — ABNORMAL HIGH (ref 26.0–34.0)
MCHC: 36.5 g/dL — ABNORMAL HIGH (ref 30.0–36.0)
MCV: 93.9 fL (ref 78.0–100.0)
RDW: 13.1 % (ref 11.5–15.5)

## 2011-03-21 LAB — HEPATIC FUNCTION PANEL
AST: 28 U/L (ref 0–37)
Albumin: 3.6 g/dL (ref 3.5–5.2)
Bilirubin, Direct: 0.1 mg/dL (ref 0.0–0.3)

## 2011-03-21 LAB — CK TOTAL AND CKMB (NOT AT ARMC): Relative Index: INVALID (ref 0.0–2.5)

## 2011-03-21 LAB — DIFFERENTIAL
Eosinophils Relative: 7 % — ABNORMAL HIGH (ref 0–5)
Lymphocytes Relative: 33 % (ref 12–46)
Lymphs Abs: 1.7 10*3/uL (ref 0.7–4.0)
Monocytes Absolute: 0.4 10*3/uL (ref 0.1–1.0)

## 2011-03-21 LAB — POCT I-STAT, CHEM 8
Chloride: 106 mEq/L (ref 96–112)
Creatinine, Ser: 0.9 mg/dL (ref 0.50–1.10)
Glucose, Bld: 95 mg/dL (ref 70–99)
Potassium: 3.8 mEq/L (ref 3.5–5.1)

## 2011-03-21 LAB — URINALYSIS, ROUTINE W REFLEX MICROSCOPIC
Bilirubin Urine: NEGATIVE
Hgb urine dipstick: NEGATIVE
Ketones, ur: NEGATIVE mg/dL
Specific Gravity, Urine: 1.012 (ref 1.005–1.030)
Urobilinogen, UA: 0.2 mg/dL (ref 0.0–1.0)
pH: 6.5 (ref 5.0–8.0)

## 2011-03-21 LAB — TROPONIN I: Troponin I: <0.3 ng/mL (ref <0.30)

## 2011-03-23 LAB — URINE CULTURE: Culture  Setup Time: 201207061925

## 2011-06-19 ENCOUNTER — Encounter: Payer: Self-pay | Admitting: Internal Medicine

## 2011-06-24 ENCOUNTER — Encounter: Payer: Self-pay | Admitting: Internal Medicine

## 2011-06-24 ENCOUNTER — Ambulatory Visit (INDEPENDENT_AMBULATORY_CARE_PROVIDER_SITE_OTHER): Payer: Medicare Other | Admitting: Internal Medicine

## 2011-06-24 ENCOUNTER — Ambulatory Visit (INDEPENDENT_AMBULATORY_CARE_PROVIDER_SITE_OTHER)
Admission: RE | Admit: 2011-06-24 | Discharge: 2011-06-24 | Disposition: A | Discharge status: Home / Self Care | Payer: Medicare Other | Source: Ambulatory Visit | Attending: Internal Medicine | Admitting: Internal Medicine

## 2011-06-24 VITALS — BP 130/80 | HR 75 | Ht 66.0 in | Wt 148.8 lb

## 2011-06-24 DIAGNOSIS — F17200 Nicotine dependence, unspecified, uncomplicated: Secondary | ICD-10-CM

## 2011-06-24 DIAGNOSIS — Z720 Tobacco use: Secondary | ICD-10-CM

## 2011-06-24 DIAGNOSIS — J984 Other disorders of lung: Secondary | ICD-10-CM

## 2011-06-24 MED ORDER — PREDNISONE (PAK) 10 MG PO TABS: ORAL_TABLET | ORAL | Status: DC

## 2011-06-24 MED ORDER — CLARITHROMYCIN 500 MG PO TABS: ORAL_TABLET | ORAL | Status: DC

## 2011-06-24 NOTE — Patient Instructions (Signed)
Order CXR  - dx tobacco user  Scripts for antibiotic and prednisone taper to hold. You can also add mucinex at any point.

## 2011-06-24 NOTE — Progress Notes (Signed)
06/24/11- 70 year old female active smoker followed for lung nodule, bronchitis, tobacco use. Last here- 06/25/2010. She is still smoking and not interested in trying to quit. She declines flu vaccine. She did not want her blood pressure checked but finally agreed to let me check it as long as she could continue to wear her sweater over her arm. She asks refills of her routine meds which were reviewed. Takes occasional Sudafed for nasal congestion. Would like to have Biaxin available. Has a rescue inhaler she uses only rarely. Denies wheeze chest tightness routine cough or phlegm bloody or purulent sputum chest pain palpitations or swollen glands. Pending varicose vein surgery.  ROS Constitutional:   No-   weight loss, night sweats, fevers, chills, fatigue, lassitude. HEENT:   No-  headaches, difficulty swallowing, tooth/dental problems, sore throat,       No-  sneezing, itching, ear ache, nasal congestion, post nasal drip,  CV:  No-   chest pain, orthopnea, PND, swelling in lower extremities, anasarca, dizziness, palpitations Resp: No-   shortness of breath with exertion or at rest.              No-   productive cough,  No non-productive cough,  No-  coughing up of blood.              No-   change in color of mucus.  No- wheezing.   Skin: No-   rash or lesions. GI:  No-   heartburn, indigestion, abdominal pain, nausea, vomiting, diarrhea,                 change in bowel habits, loss of appetite GU: No-   dysuria, change in color of urine, no urgency or frequency.  No- flank pain. MS:  No-   joint pain or swelling.  No- decreased range of motion.  No- back pain. Neuro- grossly normal to observation, Or:  Psych:  No- change in mood or affect. No depression or anxiety.  No memory loss.   OBJ General- Alert, Oriented, Affect-appropriate, Distress- none acute 130/80 by me, left arm sitting, with sweater sleeve pulled down. She is trim, but tells me she needs to lose 20 pounds. Talkative Skin-  rash-none, lesions- none, excoriation- none Lymphadenopathy- none Head- atraumatic            Eyes- Gross vision intact, PERRLA, conjunctivae clear secretions            Ears- Hearing, canals-normal            Nose- Clear, no-Septal dev, mucus, polyps, erosion, perforation             Throat- Mallampati II , mucosa clear , drainage- none, tonsils- atrophic Neck- flexible , trachea midline, no stridor , thyroid nl, carotid no bruit Chest - symmetrical excursion , unlabored           Heart/CV- RRR , no murmur , no gallop  , no rub, nl s1 s2                           - JVD- none , edema- none, stasis changes- none, varices- none           Lung- clear to P&A, wheeze- none, cough- none , dullness-none, rub- none           Chest wall-  Abd- tender-no, distended-no, bowel sounds-present, HSM- no Br/ Gen/ Rectal- Not done, not indicated Extrem- cyanosis- none, clubbing, none, atrophy- none, strength-  nl Neuro- grossly intact to observation

## 2011-06-25 NOTE — Progress Notes (Signed)
Quick Note:  LMTCB 06-25-11 ______ 

## 2011-06-26 ENCOUNTER — Encounter: Payer: Self-pay | Admitting: Internal Medicine

## 2011-06-26 NOTE — Assessment & Plan Note (Signed)
She agreed to repeat chest x-ray.

## 2011-06-26 NOTE — Assessment & Plan Note (Signed)
She is not willing to discuss smoking cessation

## 2011-06-26 NOTE — Progress Notes (Signed)
Quick Note:  Pt aware of results. ______ 

## 2011-07-04 ENCOUNTER — Other Ambulatory Visit: Payer: Self-pay | Admitting: Orthopedic Surgery

## 2011-07-04 DIAGNOSIS — M542 Cervicalgia: Secondary | ICD-10-CM

## 2011-07-04 DIAGNOSIS — M79601 Pain in right arm: Secondary | ICD-10-CM

## 2011-07-07 ENCOUNTER — Ambulatory Visit
Admission: RE | Admit: 2011-07-07 | Discharge: 2011-07-07 | Disposition: A | Discharge status: Home / Self Care | Payer: Medicare Other | Source: Ambulatory Visit | Attending: Orthopedic Surgery | Admitting: Orthopedic Surgery

## 2011-07-07 DIAGNOSIS — M542 Cervicalgia: Secondary | ICD-10-CM

## 2011-07-07 DIAGNOSIS — M79601 Pain in right arm: Secondary | ICD-10-CM

## 2011-09-17 DIAGNOSIS — G540 Brachial plexus disorders: Secondary | ICD-10-CM | POA: Diagnosis not present

## 2011-09-17 DIAGNOSIS — M9981 Other biomechanical lesions of cervical region: Secondary | ICD-10-CM | POA: Diagnosis not present

## 2011-09-17 DIAGNOSIS — M47812 Spondylosis without myelopathy or radiculopathy, cervical region: Secondary | ICD-10-CM | POA: Diagnosis not present

## 2011-09-17 DIAGNOSIS — M999 Biomechanical lesion, unspecified: Secondary | ICD-10-CM | POA: Diagnosis not present

## 2011-09-17 DIAGNOSIS — M5030 Other cervical disc degeneration, unspecified cervical region: Secondary | ICD-10-CM | POA: Diagnosis not present

## 2011-09-17 DIAGNOSIS — M542 Cervicalgia: Secondary | ICD-10-CM | POA: Diagnosis not present

## 2011-09-17 DIAGNOSIS — M546 Pain in thoracic spine: Secondary | ICD-10-CM | POA: Diagnosis not present

## 2011-09-17 DIAGNOSIS — IMO0002 Reserved for concepts with insufficient information to code with codable children: Secondary | ICD-10-CM | POA: Diagnosis not present

## 2011-09-23 DIAGNOSIS — F329 Major depressive disorder, single episode, unspecified: Secondary | ICD-10-CM | POA: Diagnosis not present

## 2011-09-23 DIAGNOSIS — R51 Headache: Secondary | ICD-10-CM | POA: Diagnosis not present

## 2011-09-25 DIAGNOSIS — M9981 Other biomechanical lesions of cervical region: Secondary | ICD-10-CM | POA: Diagnosis not present

## 2011-09-25 DIAGNOSIS — M47812 Spondylosis without myelopathy or radiculopathy, cervical region: Secondary | ICD-10-CM | POA: Diagnosis not present

## 2011-09-25 DIAGNOSIS — M542 Cervicalgia: Secondary | ICD-10-CM | POA: Diagnosis not present

## 2011-09-25 DIAGNOSIS — M5030 Other cervical disc degeneration, unspecified cervical region: Secondary | ICD-10-CM | POA: Diagnosis not present

## 2011-09-25 DIAGNOSIS — M546 Pain in thoracic spine: Secondary | ICD-10-CM | POA: Diagnosis not present

## 2011-09-25 DIAGNOSIS — G540 Brachial plexus disorders: Secondary | ICD-10-CM | POA: Diagnosis not present

## 2011-09-25 DIAGNOSIS — IMO0002 Reserved for concepts with insufficient information to code with codable children: Secondary | ICD-10-CM | POA: Diagnosis not present

## 2011-09-25 DIAGNOSIS — M999 Biomechanical lesion, unspecified: Secondary | ICD-10-CM | POA: Diagnosis not present

## 2011-10-01 DIAGNOSIS — G540 Brachial plexus disorders: Secondary | ICD-10-CM | POA: Diagnosis not present

## 2011-10-01 DIAGNOSIS — M5030 Other cervical disc degeneration, unspecified cervical region: Secondary | ICD-10-CM | POA: Diagnosis not present

## 2011-10-01 DIAGNOSIS — M542 Cervicalgia: Secondary | ICD-10-CM | POA: Diagnosis not present

## 2011-10-01 DIAGNOSIS — M999 Biomechanical lesion, unspecified: Secondary | ICD-10-CM | POA: Diagnosis not present

## 2011-10-01 DIAGNOSIS — IMO0002 Reserved for concepts with insufficient information to code with codable children: Secondary | ICD-10-CM | POA: Diagnosis not present

## 2011-10-01 DIAGNOSIS — M47812 Spondylosis without myelopathy or radiculopathy, cervical region: Secondary | ICD-10-CM | POA: Diagnosis not present

## 2011-10-01 DIAGNOSIS — M9981 Other biomechanical lesions of cervical region: Secondary | ICD-10-CM | POA: Diagnosis not present

## 2011-10-01 DIAGNOSIS — M546 Pain in thoracic spine: Secondary | ICD-10-CM | POA: Diagnosis not present

## 2011-10-09 DIAGNOSIS — M546 Pain in thoracic spine: Secondary | ICD-10-CM | POA: Diagnosis not present

## 2011-10-09 DIAGNOSIS — M47812 Spondylosis without myelopathy or radiculopathy, cervical region: Secondary | ICD-10-CM | POA: Diagnosis not present

## 2011-10-09 DIAGNOSIS — G540 Brachial plexus disorders: Secondary | ICD-10-CM | POA: Diagnosis not present

## 2011-10-09 DIAGNOSIS — M9981 Other biomechanical lesions of cervical region: Secondary | ICD-10-CM | POA: Diagnosis not present

## 2011-10-09 DIAGNOSIS — M542 Cervicalgia: Secondary | ICD-10-CM | POA: Diagnosis not present

## 2011-10-09 DIAGNOSIS — M999 Biomechanical lesion, unspecified: Secondary | ICD-10-CM | POA: Diagnosis not present

## 2011-10-09 DIAGNOSIS — IMO0002 Reserved for concepts with insufficient information to code with codable children: Secondary | ICD-10-CM | POA: Diagnosis not present

## 2011-10-09 DIAGNOSIS — M5030 Other cervical disc degeneration, unspecified cervical region: Secondary | ICD-10-CM | POA: Diagnosis not present

## 2011-11-19 DIAGNOSIS — R82998 Other abnormal findings in urine: Secondary | ICD-10-CM | POA: Diagnosis not present

## 2011-11-19 DIAGNOSIS — M810 Age-related osteoporosis without current pathological fracture: Secondary | ICD-10-CM | POA: Diagnosis not present

## 2011-11-19 DIAGNOSIS — E785 Hyperlipidemia, unspecified: Secondary | ICD-10-CM | POA: Diagnosis not present

## 2011-12-01 DIAGNOSIS — F329 Major depressive disorder, single episode, unspecified: Secondary | ICD-10-CM | POA: Diagnosis not present

## 2011-12-01 DIAGNOSIS — Z0000 Encounter for general adult medical examination without abnormal findings: Secondary | ICD-10-CM | POA: Diagnosis not present

## 2011-12-01 DIAGNOSIS — E785 Hyperlipidemia, unspecified: Secondary | ICD-10-CM | POA: Diagnosis not present

## 2011-12-01 DIAGNOSIS — N200 Calculus of kidney: Secondary | ICD-10-CM | POA: Diagnosis not present

## 2012-01-12 DIAGNOSIS — M999 Biomechanical lesion, unspecified: Secondary | ICD-10-CM | POA: Diagnosis not present

## 2012-01-12 DIAGNOSIS — M47812 Spondylosis without myelopathy or radiculopathy, cervical region: Secondary | ICD-10-CM | POA: Diagnosis not present

## 2012-01-12 DIAGNOSIS — M9981 Other biomechanical lesions of cervical region: Secondary | ICD-10-CM | POA: Diagnosis not present

## 2012-01-12 DIAGNOSIS — M5030 Other cervical disc degeneration, unspecified cervical region: Secondary | ICD-10-CM | POA: Diagnosis not present

## 2012-01-12 DIAGNOSIS — M76829 Posterior tibial tendinitis, unspecified leg: Secondary | ICD-10-CM | POA: Diagnosis not present

## 2012-01-12 DIAGNOSIS — M542 Cervicalgia: Secondary | ICD-10-CM | POA: Diagnosis not present

## 2012-01-12 DIAGNOSIS — M5137 Other intervertebral disc degeneration, lumbosacral region: Secondary | ICD-10-CM | POA: Diagnosis not present

## 2012-01-19 DIAGNOSIS — M5030 Other cervical disc degeneration, unspecified cervical region: Secondary | ICD-10-CM | POA: Diagnosis not present

## 2012-01-19 DIAGNOSIS — M9981 Other biomechanical lesions of cervical region: Secondary | ICD-10-CM | POA: Diagnosis not present

## 2012-01-19 DIAGNOSIS — M76829 Posterior tibial tendinitis, unspecified leg: Secondary | ICD-10-CM | POA: Diagnosis not present

## 2012-01-19 DIAGNOSIS — M999 Biomechanical lesion, unspecified: Secondary | ICD-10-CM | POA: Diagnosis not present

## 2012-01-19 DIAGNOSIS — M47812 Spondylosis without myelopathy or radiculopathy, cervical region: Secondary | ICD-10-CM | POA: Diagnosis not present

## 2012-01-19 DIAGNOSIS — M5137 Other intervertebral disc degeneration, lumbosacral region: Secondary | ICD-10-CM | POA: Diagnosis not present

## 2012-01-19 DIAGNOSIS — M542 Cervicalgia: Secondary | ICD-10-CM | POA: Diagnosis not present

## 2012-01-26 DIAGNOSIS — M5137 Other intervertebral disc degeneration, lumbosacral region: Secondary | ICD-10-CM | POA: Diagnosis not present

## 2012-01-26 DIAGNOSIS — M542 Cervicalgia: Secondary | ICD-10-CM | POA: Diagnosis not present

## 2012-01-26 DIAGNOSIS — M47812 Spondylosis without myelopathy or radiculopathy, cervical region: Secondary | ICD-10-CM | POA: Diagnosis not present

## 2012-01-26 DIAGNOSIS — M76829 Posterior tibial tendinitis, unspecified leg: Secondary | ICD-10-CM | POA: Diagnosis not present

## 2012-01-26 DIAGNOSIS — M999 Biomechanical lesion, unspecified: Secondary | ICD-10-CM | POA: Diagnosis not present

## 2012-01-26 DIAGNOSIS — M9981 Other biomechanical lesions of cervical region: Secondary | ICD-10-CM | POA: Diagnosis not present

## 2012-01-26 DIAGNOSIS — M5030 Other cervical disc degeneration, unspecified cervical region: Secondary | ICD-10-CM | POA: Diagnosis not present

## 2012-02-02 DIAGNOSIS — M999 Biomechanical lesion, unspecified: Secondary | ICD-10-CM | POA: Diagnosis not present

## 2012-02-02 DIAGNOSIS — M47812 Spondylosis without myelopathy or radiculopathy, cervical region: Secondary | ICD-10-CM | POA: Diagnosis not present

## 2012-02-02 DIAGNOSIS — M76829 Posterior tibial tendinitis, unspecified leg: Secondary | ICD-10-CM | POA: Diagnosis not present

## 2012-02-02 DIAGNOSIS — M542 Cervicalgia: Secondary | ICD-10-CM | POA: Diagnosis not present

## 2012-02-02 DIAGNOSIS — M5137 Other intervertebral disc degeneration, lumbosacral region: Secondary | ICD-10-CM | POA: Diagnosis not present

## 2012-02-02 DIAGNOSIS — M9981 Other biomechanical lesions of cervical region: Secondary | ICD-10-CM | POA: Diagnosis not present

## 2012-02-02 DIAGNOSIS — M5030 Other cervical disc degeneration, unspecified cervical region: Secondary | ICD-10-CM | POA: Diagnosis not present

## 2012-02-03 DIAGNOSIS — J310 Chronic rhinitis: Secondary | ICD-10-CM | POA: Diagnosis not present

## 2012-02-03 DIAGNOSIS — J322 Chronic ethmoidal sinusitis: Secondary | ICD-10-CM | POA: Diagnosis not present

## 2012-02-10 DIAGNOSIS — M9981 Other biomechanical lesions of cervical region: Secondary | ICD-10-CM | POA: Diagnosis not present

## 2012-02-10 DIAGNOSIS — M542 Cervicalgia: Secondary | ICD-10-CM | POA: Diagnosis not present

## 2012-02-10 DIAGNOSIS — M999 Biomechanical lesion, unspecified: Secondary | ICD-10-CM | POA: Diagnosis not present

## 2012-02-10 DIAGNOSIS — M5030 Other cervical disc degeneration, unspecified cervical region: Secondary | ICD-10-CM | POA: Diagnosis not present

## 2012-02-10 DIAGNOSIS — M76829 Posterior tibial tendinitis, unspecified leg: Secondary | ICD-10-CM | POA: Diagnosis not present

## 2012-02-10 DIAGNOSIS — M47812 Spondylosis without myelopathy or radiculopathy, cervical region: Secondary | ICD-10-CM | POA: Diagnosis not present

## 2012-02-10 DIAGNOSIS — M5137 Other intervertebral disc degeneration, lumbosacral region: Secondary | ICD-10-CM | POA: Diagnosis not present

## 2012-02-17 DIAGNOSIS — M5030 Other cervical disc degeneration, unspecified cervical region: Secondary | ICD-10-CM | POA: Diagnosis not present

## 2012-02-17 DIAGNOSIS — M542 Cervicalgia: Secondary | ICD-10-CM | POA: Diagnosis not present

## 2012-02-17 DIAGNOSIS — M47812 Spondylosis without myelopathy or radiculopathy, cervical region: Secondary | ICD-10-CM | POA: Diagnosis not present

## 2012-02-17 DIAGNOSIS — M9981 Other biomechanical lesions of cervical region: Secondary | ICD-10-CM | POA: Diagnosis not present

## 2012-02-17 DIAGNOSIS — M5137 Other intervertebral disc degeneration, lumbosacral region: Secondary | ICD-10-CM | POA: Diagnosis not present

## 2012-02-17 DIAGNOSIS — M76829 Posterior tibial tendinitis, unspecified leg: Secondary | ICD-10-CM | POA: Diagnosis not present

## 2012-02-17 DIAGNOSIS — M999 Biomechanical lesion, unspecified: Secondary | ICD-10-CM | POA: Diagnosis not present

## 2012-02-23 DIAGNOSIS — J029 Acute pharyngitis, unspecified: Secondary | ICD-10-CM | POA: Diagnosis not present

## 2012-02-23 DIAGNOSIS — R490 Dysphonia: Secondary | ICD-10-CM | POA: Diagnosis not present

## 2012-02-24 DIAGNOSIS — M76829 Posterior tibial tendinitis, unspecified leg: Secondary | ICD-10-CM | POA: Diagnosis not present

## 2012-02-24 DIAGNOSIS — M5030 Other cervical disc degeneration, unspecified cervical region: Secondary | ICD-10-CM | POA: Diagnosis not present

## 2012-02-24 DIAGNOSIS — M47812 Spondylosis without myelopathy or radiculopathy, cervical region: Secondary | ICD-10-CM | POA: Diagnosis not present

## 2012-02-24 DIAGNOSIS — M999 Biomechanical lesion, unspecified: Secondary | ICD-10-CM | POA: Diagnosis not present

## 2012-02-24 DIAGNOSIS — M5137 Other intervertebral disc degeneration, lumbosacral region: Secondary | ICD-10-CM | POA: Diagnosis not present

## 2012-02-24 DIAGNOSIS — M542 Cervicalgia: Secondary | ICD-10-CM | POA: Diagnosis not present

## 2012-02-24 DIAGNOSIS — M9981 Other biomechanical lesions of cervical region: Secondary | ICD-10-CM | POA: Diagnosis not present

## 2012-03-02 DIAGNOSIS — M542 Cervicalgia: Secondary | ICD-10-CM | POA: Diagnosis not present

## 2012-03-02 DIAGNOSIS — M47812 Spondylosis without myelopathy or radiculopathy, cervical region: Secondary | ICD-10-CM | POA: Diagnosis not present

## 2012-03-02 DIAGNOSIS — M5030 Other cervical disc degeneration, unspecified cervical region: Secondary | ICD-10-CM | POA: Diagnosis not present

## 2012-03-02 DIAGNOSIS — M999 Biomechanical lesion, unspecified: Secondary | ICD-10-CM | POA: Diagnosis not present

## 2012-03-02 DIAGNOSIS — M76829 Posterior tibial tendinitis, unspecified leg: Secondary | ICD-10-CM | POA: Diagnosis not present

## 2012-03-02 DIAGNOSIS — M9981 Other biomechanical lesions of cervical region: Secondary | ICD-10-CM | POA: Diagnosis not present

## 2012-03-02 DIAGNOSIS — M5137 Other intervertebral disc degeneration, lumbosacral region: Secondary | ICD-10-CM | POA: Diagnosis not present

## 2012-03-09 DIAGNOSIS — M5030 Other cervical disc degeneration, unspecified cervical region: Secondary | ICD-10-CM | POA: Diagnosis not present

## 2012-03-09 DIAGNOSIS — M47812 Spondylosis without myelopathy or radiculopathy, cervical region: Secondary | ICD-10-CM | POA: Diagnosis not present

## 2012-03-09 DIAGNOSIS — M76829 Posterior tibial tendinitis, unspecified leg: Secondary | ICD-10-CM | POA: Diagnosis not present

## 2012-03-09 DIAGNOSIS — M542 Cervicalgia: Secondary | ICD-10-CM | POA: Diagnosis not present

## 2012-03-09 DIAGNOSIS — M5137 Other intervertebral disc degeneration, lumbosacral region: Secondary | ICD-10-CM | POA: Diagnosis not present

## 2012-03-09 DIAGNOSIS — M9981 Other biomechanical lesions of cervical region: Secondary | ICD-10-CM | POA: Diagnosis not present

## 2012-03-09 DIAGNOSIS — M999 Biomechanical lesion, unspecified: Secondary | ICD-10-CM | POA: Diagnosis not present

## 2012-03-16 DIAGNOSIS — M76829 Posterior tibial tendinitis, unspecified leg: Secondary | ICD-10-CM | POA: Diagnosis not present

## 2012-03-16 DIAGNOSIS — M542 Cervicalgia: Secondary | ICD-10-CM | POA: Diagnosis not present

## 2012-03-16 DIAGNOSIS — M47812 Spondylosis without myelopathy or radiculopathy, cervical region: Secondary | ICD-10-CM | POA: Diagnosis not present

## 2012-03-16 DIAGNOSIS — M5137 Other intervertebral disc degeneration, lumbosacral region: Secondary | ICD-10-CM | POA: Diagnosis not present

## 2012-03-16 DIAGNOSIS — M9981 Other biomechanical lesions of cervical region: Secondary | ICD-10-CM | POA: Diagnosis not present

## 2012-03-16 DIAGNOSIS — M999 Biomechanical lesion, unspecified: Secondary | ICD-10-CM | POA: Diagnosis not present

## 2012-03-16 DIAGNOSIS — M5030 Other cervical disc degeneration, unspecified cervical region: Secondary | ICD-10-CM | POA: Diagnosis not present

## 2012-03-22 DIAGNOSIS — L821 Other seborrheic keratosis: Secondary | ICD-10-CM | POA: Diagnosis not present

## 2012-03-22 DIAGNOSIS — L570 Actinic keratosis: Secondary | ICD-10-CM | POA: Diagnosis not present

## 2012-03-22 DIAGNOSIS — L719 Rosacea, unspecified: Secondary | ICD-10-CM | POA: Diagnosis not present

## 2012-03-22 DIAGNOSIS — D485 Neoplasm of uncertain behavior of skin: Secondary | ICD-10-CM | POA: Diagnosis not present

## 2012-03-22 DIAGNOSIS — D239 Other benign neoplasm of skin, unspecified: Secondary | ICD-10-CM | POA: Diagnosis not present

## 2012-03-23 DIAGNOSIS — M76829 Posterior tibial tendinitis, unspecified leg: Secondary | ICD-10-CM | POA: Diagnosis not present

## 2012-03-23 DIAGNOSIS — M542 Cervicalgia: Secondary | ICD-10-CM | POA: Diagnosis not present

## 2012-03-23 DIAGNOSIS — M999 Biomechanical lesion, unspecified: Secondary | ICD-10-CM | POA: Diagnosis not present

## 2012-03-23 DIAGNOSIS — M47812 Spondylosis without myelopathy or radiculopathy, cervical region: Secondary | ICD-10-CM | POA: Diagnosis not present

## 2012-03-23 DIAGNOSIS — M5030 Other cervical disc degeneration, unspecified cervical region: Secondary | ICD-10-CM | POA: Diagnosis not present

## 2012-03-23 DIAGNOSIS — M9981 Other biomechanical lesions of cervical region: Secondary | ICD-10-CM | POA: Diagnosis not present

## 2012-03-23 DIAGNOSIS — M5137 Other intervertebral disc degeneration, lumbosacral region: Secondary | ICD-10-CM | POA: Diagnosis not present

## 2012-04-06 DIAGNOSIS — M5030 Other cervical disc degeneration, unspecified cervical region: Secondary | ICD-10-CM | POA: Diagnosis not present

## 2012-04-06 DIAGNOSIS — M999 Biomechanical lesion, unspecified: Secondary | ICD-10-CM | POA: Diagnosis not present

## 2012-04-06 DIAGNOSIS — M76829 Posterior tibial tendinitis, unspecified leg: Secondary | ICD-10-CM | POA: Diagnosis not present

## 2012-04-06 DIAGNOSIS — M5137 Other intervertebral disc degeneration, lumbosacral region: Secondary | ICD-10-CM | POA: Diagnosis not present

## 2012-04-06 DIAGNOSIS — M47812 Spondylosis without myelopathy or radiculopathy, cervical region: Secondary | ICD-10-CM | POA: Diagnosis not present

## 2012-04-06 DIAGNOSIS — M542 Cervicalgia: Secondary | ICD-10-CM | POA: Diagnosis not present

## 2012-04-06 DIAGNOSIS — M9981 Other biomechanical lesions of cervical region: Secondary | ICD-10-CM | POA: Diagnosis not present

## 2012-04-20 DIAGNOSIS — M9981 Other biomechanical lesions of cervical region: Secondary | ICD-10-CM | POA: Diagnosis not present

## 2012-04-20 DIAGNOSIS — M5030 Other cervical disc degeneration, unspecified cervical region: Secondary | ICD-10-CM | POA: Diagnosis not present

## 2012-04-20 DIAGNOSIS — M5137 Other intervertebral disc degeneration, lumbosacral region: Secondary | ICD-10-CM | POA: Diagnosis not present

## 2012-04-20 DIAGNOSIS — M546 Pain in thoracic spine: Secondary | ICD-10-CM | POA: Diagnosis not present

## 2012-04-20 DIAGNOSIS — M542 Cervicalgia: Secondary | ICD-10-CM | POA: Diagnosis not present

## 2012-04-20 DIAGNOSIS — M999 Biomechanical lesion, unspecified: Secondary | ICD-10-CM | POA: Diagnosis not present

## 2012-04-20 DIAGNOSIS — M47812 Spondylosis without myelopathy or radiculopathy, cervical region: Secondary | ICD-10-CM | POA: Diagnosis not present

## 2012-05-04 DIAGNOSIS — M546 Pain in thoracic spine: Secondary | ICD-10-CM | POA: Diagnosis not present

## 2012-05-04 DIAGNOSIS — M999 Biomechanical lesion, unspecified: Secondary | ICD-10-CM | POA: Diagnosis not present

## 2012-05-04 DIAGNOSIS — M47812 Spondylosis without myelopathy or radiculopathy, cervical region: Secondary | ICD-10-CM | POA: Diagnosis not present

## 2012-05-04 DIAGNOSIS — M5030 Other cervical disc degeneration, unspecified cervical region: Secondary | ICD-10-CM | POA: Diagnosis not present

## 2012-05-04 DIAGNOSIS — M542 Cervicalgia: Secondary | ICD-10-CM | POA: Diagnosis not present

## 2012-05-04 DIAGNOSIS — M5137 Other intervertebral disc degeneration, lumbosacral region: Secondary | ICD-10-CM | POA: Diagnosis not present

## 2012-05-04 DIAGNOSIS — M9981 Other biomechanical lesions of cervical region: Secondary | ICD-10-CM | POA: Diagnosis not present

## 2012-05-19 DIAGNOSIS — M5030 Other cervical disc degeneration, unspecified cervical region: Secondary | ICD-10-CM | POA: Diagnosis not present

## 2012-05-19 DIAGNOSIS — M9981 Other biomechanical lesions of cervical region: Secondary | ICD-10-CM | POA: Diagnosis not present

## 2012-05-19 DIAGNOSIS — M5137 Other intervertebral disc degeneration, lumbosacral region: Secondary | ICD-10-CM | POA: Diagnosis not present

## 2012-05-19 DIAGNOSIS — M47812 Spondylosis without myelopathy or radiculopathy, cervical region: Secondary | ICD-10-CM | POA: Diagnosis not present

## 2012-05-19 DIAGNOSIS — M542 Cervicalgia: Secondary | ICD-10-CM | POA: Diagnosis not present

## 2012-05-19 DIAGNOSIS — M546 Pain in thoracic spine: Secondary | ICD-10-CM | POA: Diagnosis not present

## 2012-05-19 DIAGNOSIS — M999 Biomechanical lesion, unspecified: Secondary | ICD-10-CM | POA: Diagnosis not present

## 2012-06-01 DIAGNOSIS — M999 Biomechanical lesion, unspecified: Secondary | ICD-10-CM | POA: Diagnosis not present

## 2012-06-01 DIAGNOSIS — M5030 Other cervical disc degeneration, unspecified cervical region: Secondary | ICD-10-CM | POA: Diagnosis not present

## 2012-06-01 DIAGNOSIS — M5137 Other intervertebral disc degeneration, lumbosacral region: Secondary | ICD-10-CM | POA: Diagnosis not present

## 2012-06-01 DIAGNOSIS — M546 Pain in thoracic spine: Secondary | ICD-10-CM | POA: Diagnosis not present

## 2012-06-01 DIAGNOSIS — M47812 Spondylosis without myelopathy or radiculopathy, cervical region: Secondary | ICD-10-CM | POA: Diagnosis not present

## 2012-06-01 DIAGNOSIS — M9981 Other biomechanical lesions of cervical region: Secondary | ICD-10-CM | POA: Diagnosis not present

## 2012-06-01 DIAGNOSIS — M542 Cervicalgia: Secondary | ICD-10-CM | POA: Diagnosis not present

## 2012-06-07 DIAGNOSIS — Z961 Presence of intraocular lens: Secondary | ICD-10-CM | POA: Diagnosis not present

## 2012-06-15 DIAGNOSIS — M542 Cervicalgia: Secondary | ICD-10-CM | POA: Diagnosis not present

## 2012-06-15 DIAGNOSIS — M546 Pain in thoracic spine: Secondary | ICD-10-CM | POA: Diagnosis not present

## 2012-06-15 DIAGNOSIS — M5137 Other intervertebral disc degeneration, lumbosacral region: Secondary | ICD-10-CM | POA: Diagnosis not present

## 2012-06-15 DIAGNOSIS — M9981 Other biomechanical lesions of cervical region: Secondary | ICD-10-CM | POA: Diagnosis not present

## 2012-06-15 DIAGNOSIS — M47812 Spondylosis without myelopathy or radiculopathy, cervical region: Secondary | ICD-10-CM | POA: Diagnosis not present

## 2012-06-15 DIAGNOSIS — M999 Biomechanical lesion, unspecified: Secondary | ICD-10-CM | POA: Diagnosis not present

## 2012-06-15 DIAGNOSIS — M5030 Other cervical disc degeneration, unspecified cervical region: Secondary | ICD-10-CM | POA: Diagnosis not present

## 2012-06-22 ENCOUNTER — Encounter: Payer: Self-pay | Admitting: Internal Medicine

## 2012-06-22 ENCOUNTER — Ambulatory Visit (INDEPENDENT_AMBULATORY_CARE_PROVIDER_SITE_OTHER): Payer: Medicare Other | Admitting: Internal Medicine

## 2012-06-22 VITALS — BP 122/58 | HR 81 | Ht 66.0 in | Wt 142.6 lb

## 2012-06-22 DIAGNOSIS — F17200 Nicotine dependence, unspecified, uncomplicated: Secondary | ICD-10-CM

## 2012-06-22 DIAGNOSIS — R490 Dysphonia: Secondary | ICD-10-CM | POA: Diagnosis not present

## 2012-06-22 DIAGNOSIS — J40 Bronchitis, not specified as acute or chronic: Secondary | ICD-10-CM

## 2012-06-22 MED ORDER — PREDNISONE (PAK) 10 MG PO TABS: ORAL_TABLET | ORAL | Status: AC

## 2012-06-22 MED ORDER — CLARITHROMYCIN 500 MG PO TABS: ORAL_TABLET | ORAL | Status: AC

## 2012-06-22 NOTE — Progress Notes (Signed)
06/24/11- 71 year old female active smoker followed for lung nodule, bronchitis, tobacco use. Last here- 06/25/2010. She is still smoking and not interested in trying to quit. She declines flu vaccine. She did not want her blood pressure checked but finally agreed to let me check it as long as she could continue to wear her sweater over her arm. She asks refills of her routine meds which were reviewed. Takes occasional Sudafed for nasal congestion. Would like to have Biaxin available. Has a rescue inhaler she uses only rarely. Denies wheeze chest tightness routine cough or phlegm bloody or purulent sputum chest pain palpitations or swollen glands. Pending varicose vein surgery.  06/22/12- 71 year old female active smoker followed for lung nodule, bronchitis, tobacco use. Last here- 06/25/2010. 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 Her continued smoking is likely to irritate her cords as well-discussed with her. She took prednisone and antibiotic for a summer cold. Continue Sudafed and antihistamines each morning and one half antihistamine( clemastine) at bedtime  CXR 06/26/11- reviewed with her IMPRESSION:  COPD. No active cardiopulmonary disease.  Original Report Authenticated By: Cyndie Chime, M.D.   ROS-see HPI Constitutional:   No-   weight loss, night sweats, fevers, chills, fatigue, lassitude. HEENT:   No-  headaches, difficulty swallowing, tooth/dental problems, +sore throat,       No-  sneezing, itching, ear ache, +nasal congestion, post nasal drip,  CV:  No-   chest pain, orthopnea, PND, swelling in lower extremities, anasarca, dizziness, palpitations Resp: No-   shortness of breath with exertion or at rest.              No-   productive cough,  No non-productive cough,  No-  coughing up of  blood.              No-   change in color of mucus.  No- wheezing.   Skin: No-   rash or lesions. GI:  No-   heartburn, indigestion, abdominal pain, nausea, vomiting,  GU:  MS:  No-   joint pain or swelling.   Neuro- nothing unusual Psych:  No- change in mood or affect. No depression or anxiety.  No memory loss.   OBJ BP 122/58  Pulse 81  Ht 5\' 6"  (1.676 m)  Wt 142 lb 9.6 oz (64.683 kg)  BMI 23.02 kg/m2  SpO2 99% General- Alert, Oriented, Affect-appropriate, Distress- none acute  She is trim, Talkative Skin- rash-none, lesions- none, excoriation- none Lymphadenopathy- none Head- atraumatic            Eyes- Gross vision intact, PERRLA, conjunctivae clear secretions            Ears- Hearing, canals-normal            Nose- Clear, no-Septal dev, mucus, polyps, erosion, perforation             Throat- Mallampati II-III , mucosa clear , drainage- none, tonsils- atrophic Neck- flexible , trachea midline, no stridor , thyroid nl, carotid no bruit Chest - symmetrical excursion , unlabored           Heart/CV- RRR , no murmur , no gallop  , no rub, nl s1 s2                           -  JVD- none , edema- none, stasis changes- none, varices- none           Lung- clear to P&A, wheeze- none, cough- none , dullness-none, rub- none           Chest wall-  Abd-  Br/ Gen/ Rectal- Not done, not indicated Extrem- cyanosis- none, clubbing, none, atrophy- none, strength- nl Neuro- grossly intact to observation

## 2012-06-22 NOTE — Patient Instructions (Addendum)
Sample Dymista nasal spray 1-2 puffs each nostril once daily at bedtime    Scripts to hold for biaxin and prednisone  Remember the importance of voice rest  Watch for reflux, which can irritate the vocal cords

## 2012-06-28 ENCOUNTER — Encounter: Payer: Self-pay | Admitting: Internal Medicine

## 2012-06-28 DIAGNOSIS — R490 Dysphonia: Secondary | ICD-10-CM | POA: Insufficient documentation

## 2012-06-28 NOTE — Assessment & Plan Note (Signed)
She does not discuss smoking cessation. I will continue to nudge her as opportunity presents.

## 2012-06-28 NOTE — Assessment & Plan Note (Signed)
Vocal cord stress from overuse. A component of postnasal drainage is not excluded and can be addressed with a trial of Dymista nasal spray.

## 2012-06-28 NOTE — Assessment & Plan Note (Addendum)
Took prednisone and antibiotics this summer for bronchitis with a cold but comfortable since then. We gave Biaxin and prednisone prescription to hold with discussion

## 2012-06-29 DIAGNOSIS — M546 Pain in thoracic spine: Secondary | ICD-10-CM | POA: Diagnosis not present

## 2012-06-29 DIAGNOSIS — M999 Biomechanical lesion, unspecified: Secondary | ICD-10-CM | POA: Diagnosis not present

## 2012-06-29 DIAGNOSIS — M47812 Spondylosis without myelopathy or radiculopathy, cervical region: Secondary | ICD-10-CM | POA: Diagnosis not present

## 2012-06-29 DIAGNOSIS — M9981 Other biomechanical lesions of cervical region: Secondary | ICD-10-CM | POA: Diagnosis not present

## 2012-06-29 DIAGNOSIS — M5030 Other cervical disc degeneration, unspecified cervical region: Secondary | ICD-10-CM | POA: Diagnosis not present

## 2012-06-29 DIAGNOSIS — M5137 Other intervertebral disc degeneration, lumbosacral region: Secondary | ICD-10-CM | POA: Diagnosis not present

## 2012-06-29 DIAGNOSIS — M542 Cervicalgia: Secondary | ICD-10-CM | POA: Diagnosis not present

## 2012-07-05 DIAGNOSIS — M19049 Primary osteoarthritis, unspecified hand: Secondary | ICD-10-CM | POA: Diagnosis not present

## 2012-07-13 DIAGNOSIS — M5030 Other cervical disc degeneration, unspecified cervical region: Secondary | ICD-10-CM | POA: Diagnosis not present

## 2012-07-13 DIAGNOSIS — M546 Pain in thoracic spine: Secondary | ICD-10-CM | POA: Diagnosis not present

## 2012-07-13 DIAGNOSIS — M999 Biomechanical lesion, unspecified: Secondary | ICD-10-CM | POA: Diagnosis not present

## 2012-07-13 DIAGNOSIS — M5137 Other intervertebral disc degeneration, lumbosacral region: Secondary | ICD-10-CM | POA: Diagnosis not present

## 2012-07-13 DIAGNOSIS — M47812 Spondylosis without myelopathy or radiculopathy, cervical region: Secondary | ICD-10-CM | POA: Diagnosis not present

## 2012-07-13 DIAGNOSIS — M9981 Other biomechanical lesions of cervical region: Secondary | ICD-10-CM | POA: Diagnosis not present

## 2012-07-13 DIAGNOSIS — M542 Cervicalgia: Secondary | ICD-10-CM | POA: Diagnosis not present

## 2012-07-26 DIAGNOSIS — M19049 Primary osteoarthritis, unspecified hand: Secondary | ICD-10-CM | POA: Diagnosis not present

## 2012-08-03 DIAGNOSIS — M9981 Other biomechanical lesions of cervical region: Secondary | ICD-10-CM | POA: Diagnosis not present

## 2012-08-03 DIAGNOSIS — M5137 Other intervertebral disc degeneration, lumbosacral region: Secondary | ICD-10-CM | POA: Diagnosis not present

## 2012-08-03 DIAGNOSIS — M999 Biomechanical lesion, unspecified: Secondary | ICD-10-CM | POA: Diagnosis not present

## 2012-08-03 DIAGNOSIS — M47812 Spondylosis without myelopathy or radiculopathy, cervical region: Secondary | ICD-10-CM | POA: Diagnosis not present

## 2012-08-03 DIAGNOSIS — M542 Cervicalgia: Secondary | ICD-10-CM | POA: Diagnosis not present

## 2012-08-03 DIAGNOSIS — M546 Pain in thoracic spine: Secondary | ICD-10-CM | POA: Diagnosis not present

## 2012-08-03 DIAGNOSIS — M5030 Other cervical disc degeneration, unspecified cervical region: Secondary | ICD-10-CM | POA: Diagnosis not present

## 2012-08-24 DIAGNOSIS — M5030 Other cervical disc degeneration, unspecified cervical region: Secondary | ICD-10-CM | POA: Diagnosis not present

## 2012-08-24 DIAGNOSIS — M47812 Spondylosis without myelopathy or radiculopathy, cervical region: Secondary | ICD-10-CM | POA: Diagnosis not present

## 2012-08-24 DIAGNOSIS — M546 Pain in thoracic spine: Secondary | ICD-10-CM | POA: Diagnosis not present

## 2012-08-24 DIAGNOSIS — M5137 Other intervertebral disc degeneration, lumbosacral region: Secondary | ICD-10-CM | POA: Diagnosis not present

## 2012-08-24 DIAGNOSIS — M9981 Other biomechanical lesions of cervical region: Secondary | ICD-10-CM | POA: Diagnosis not present

## 2012-08-24 DIAGNOSIS — M542 Cervicalgia: Secondary | ICD-10-CM | POA: Diagnosis not present

## 2012-08-24 DIAGNOSIS — M999 Biomechanical lesion, unspecified: Secondary | ICD-10-CM | POA: Diagnosis not present

## 2012-09-13 DIAGNOSIS — M47812 Spondylosis without myelopathy or radiculopathy, cervical region: Secondary | ICD-10-CM | POA: Diagnosis not present

## 2012-09-13 DIAGNOSIS — M9981 Other biomechanical lesions of cervical region: Secondary | ICD-10-CM | POA: Diagnosis not present

## 2012-09-13 DIAGNOSIS — M546 Pain in thoracic spine: Secondary | ICD-10-CM | POA: Diagnosis not present

## 2012-09-13 DIAGNOSIS — M5137 Other intervertebral disc degeneration, lumbosacral region: Secondary | ICD-10-CM | POA: Diagnosis not present

## 2012-09-13 DIAGNOSIS — M542 Cervicalgia: Secondary | ICD-10-CM | POA: Diagnosis not present

## 2012-09-13 DIAGNOSIS — M999 Biomechanical lesion, unspecified: Secondary | ICD-10-CM | POA: Diagnosis not present

## 2012-09-13 DIAGNOSIS — M5030 Other cervical disc degeneration, unspecified cervical region: Secondary | ICD-10-CM | POA: Diagnosis not present

## 2012-10-06 DIAGNOSIS — M999 Biomechanical lesion, unspecified: Secondary | ICD-10-CM | POA: Diagnosis not present

## 2012-10-06 DIAGNOSIS — M5030 Other cervical disc degeneration, unspecified cervical region: Secondary | ICD-10-CM | POA: Diagnosis not present

## 2012-10-06 DIAGNOSIS — M542 Cervicalgia: Secondary | ICD-10-CM | POA: Diagnosis not present

## 2012-10-06 DIAGNOSIS — M47812 Spondylosis without myelopathy or radiculopathy, cervical region: Secondary | ICD-10-CM | POA: Diagnosis not present

## 2012-10-06 DIAGNOSIS — M546 Pain in thoracic spine: Secondary | ICD-10-CM | POA: Diagnosis not present

## 2012-10-06 DIAGNOSIS — M9981 Other biomechanical lesions of cervical region: Secondary | ICD-10-CM | POA: Diagnosis not present

## 2012-10-06 DIAGNOSIS — M5137 Other intervertebral disc degeneration, lumbosacral region: Secondary | ICD-10-CM | POA: Diagnosis not present

## 2012-10-26 DIAGNOSIS — M9981 Other biomechanical lesions of cervical region: Secondary | ICD-10-CM | POA: Diagnosis not present

## 2012-10-26 DIAGNOSIS — M5030 Other cervical disc degeneration, unspecified cervical region: Secondary | ICD-10-CM | POA: Diagnosis not present

## 2012-10-26 DIAGNOSIS — M5137 Other intervertebral disc degeneration, lumbosacral region: Secondary | ICD-10-CM | POA: Diagnosis not present

## 2012-10-26 DIAGNOSIS — M546 Pain in thoracic spine: Secondary | ICD-10-CM | POA: Diagnosis not present

## 2012-10-26 DIAGNOSIS — M542 Cervicalgia: Secondary | ICD-10-CM | POA: Diagnosis not present

## 2012-10-26 DIAGNOSIS — M999 Biomechanical lesion, unspecified: Secondary | ICD-10-CM | POA: Diagnosis not present

## 2012-10-26 DIAGNOSIS — M47812 Spondylosis without myelopathy or radiculopathy, cervical region: Secondary | ICD-10-CM | POA: Diagnosis not present

## 2012-12-01 DIAGNOSIS — Z79899 Other long term (current) drug therapy: Secondary | ICD-10-CM | POA: Diagnosis not present

## 2012-12-06 DIAGNOSIS — R635 Abnormal weight gain: Secondary | ICD-10-CM | POA: Diagnosis not present

## 2012-12-06 DIAGNOSIS — E785 Hyperlipidemia, unspecified: Secondary | ICD-10-CM | POA: Diagnosis not present

## 2012-12-06 DIAGNOSIS — IMO0002 Reserved for concepts with insufficient information to code with codable children: Secondary | ICD-10-CM | POA: Diagnosis not present

## 2012-12-06 DIAGNOSIS — G4700 Insomnia, unspecified: Secondary | ICD-10-CM | POA: Diagnosis not present

## 2012-12-06 DIAGNOSIS — L719 Rosacea, unspecified: Secondary | ICD-10-CM | POA: Diagnosis not present

## 2012-12-06 DIAGNOSIS — Z0000 Encounter for general adult medical examination without abnormal findings: Secondary | ICD-10-CM | POA: Diagnosis not present

## 2012-12-06 DIAGNOSIS — M810 Age-related osteoporosis without current pathological fracture: Secondary | ICD-10-CM | POA: Diagnosis not present

## 2012-12-06 DIAGNOSIS — Z09 Encounter for follow-up examination after completed treatment for conditions other than malignant neoplasm: Secondary | ICD-10-CM | POA: Diagnosis not present

## 2012-12-07 DIAGNOSIS — M5030 Other cervical disc degeneration, unspecified cervical region: Secondary | ICD-10-CM | POA: Diagnosis not present

## 2012-12-07 DIAGNOSIS — M546 Pain in thoracic spine: Secondary | ICD-10-CM | POA: Diagnosis not present

## 2012-12-07 DIAGNOSIS — M999 Biomechanical lesion, unspecified: Secondary | ICD-10-CM | POA: Diagnosis not present

## 2012-12-07 DIAGNOSIS — M47812 Spondylosis without myelopathy or radiculopathy, cervical region: Secondary | ICD-10-CM | POA: Diagnosis not present

## 2012-12-07 DIAGNOSIS — M9981 Other biomechanical lesions of cervical region: Secondary | ICD-10-CM | POA: Diagnosis not present

## 2012-12-07 DIAGNOSIS — M542 Cervicalgia: Secondary | ICD-10-CM | POA: Diagnosis not present

## 2012-12-07 DIAGNOSIS — M5137 Other intervertebral disc degeneration, lumbosacral region: Secondary | ICD-10-CM | POA: Diagnosis not present

## 2012-12-28 DIAGNOSIS — M542 Cervicalgia: Secondary | ICD-10-CM | POA: Diagnosis not present

## 2012-12-28 DIAGNOSIS — M546 Pain in thoracic spine: Secondary | ICD-10-CM | POA: Diagnosis not present

## 2012-12-28 DIAGNOSIS — M47812 Spondylosis without myelopathy or radiculopathy, cervical region: Secondary | ICD-10-CM | POA: Diagnosis not present

## 2012-12-28 DIAGNOSIS — M999 Biomechanical lesion, unspecified: Secondary | ICD-10-CM | POA: Diagnosis not present

## 2012-12-28 DIAGNOSIS — M5030 Other cervical disc degeneration, unspecified cervical region: Secondary | ICD-10-CM | POA: Diagnosis not present

## 2012-12-28 DIAGNOSIS — M5137 Other intervertebral disc degeneration, lumbosacral region: Secondary | ICD-10-CM | POA: Diagnosis not present

## 2012-12-28 DIAGNOSIS — M9981 Other biomechanical lesions of cervical region: Secondary | ICD-10-CM | POA: Diagnosis not present

## 2013-01-18 DIAGNOSIS — M999 Biomechanical lesion, unspecified: Secondary | ICD-10-CM | POA: Diagnosis not present

## 2013-01-18 DIAGNOSIS — M546 Pain in thoracic spine: Secondary | ICD-10-CM | POA: Diagnosis not present

## 2013-01-18 DIAGNOSIS — M5030 Other cervical disc degeneration, unspecified cervical region: Secondary | ICD-10-CM | POA: Diagnosis not present

## 2013-01-18 DIAGNOSIS — M542 Cervicalgia: Secondary | ICD-10-CM | POA: Diagnosis not present

## 2013-01-18 DIAGNOSIS — M5137 Other intervertebral disc degeneration, lumbosacral region: Secondary | ICD-10-CM | POA: Diagnosis not present

## 2013-01-18 DIAGNOSIS — M47812 Spondylosis without myelopathy or radiculopathy, cervical region: Secondary | ICD-10-CM | POA: Diagnosis not present

## 2013-01-18 DIAGNOSIS — M9981 Other biomechanical lesions of cervical region: Secondary | ICD-10-CM | POA: Diagnosis not present

## 2013-02-08 DIAGNOSIS — M546 Pain in thoracic spine: Secondary | ICD-10-CM | POA: Diagnosis not present

## 2013-02-08 DIAGNOSIS — M5137 Other intervertebral disc degeneration, lumbosacral region: Secondary | ICD-10-CM | POA: Diagnosis not present

## 2013-02-08 DIAGNOSIS — M9981 Other biomechanical lesions of cervical region: Secondary | ICD-10-CM | POA: Diagnosis not present

## 2013-02-08 DIAGNOSIS — M5030 Other cervical disc degeneration, unspecified cervical region: Secondary | ICD-10-CM | POA: Diagnosis not present

## 2013-02-08 DIAGNOSIS — M999 Biomechanical lesion, unspecified: Secondary | ICD-10-CM | POA: Diagnosis not present

## 2013-02-08 DIAGNOSIS — M47812 Spondylosis without myelopathy or radiculopathy, cervical region: Secondary | ICD-10-CM | POA: Diagnosis not present

## 2013-02-08 DIAGNOSIS — M542 Cervicalgia: Secondary | ICD-10-CM | POA: Diagnosis not present

## 2013-02-15 DIAGNOSIS — M999 Biomechanical lesion, unspecified: Secondary | ICD-10-CM | POA: Diagnosis not present

## 2013-02-15 DIAGNOSIS — M5030 Other cervical disc degeneration, unspecified cervical region: Secondary | ICD-10-CM | POA: Diagnosis not present

## 2013-02-15 DIAGNOSIS — M47812 Spondylosis without myelopathy or radiculopathy, cervical region: Secondary | ICD-10-CM | POA: Diagnosis not present

## 2013-02-15 DIAGNOSIS — M546 Pain in thoracic spine: Secondary | ICD-10-CM | POA: Diagnosis not present

## 2013-02-15 DIAGNOSIS — M542 Cervicalgia: Secondary | ICD-10-CM | POA: Diagnosis not present

## 2013-02-15 DIAGNOSIS — M9981 Other biomechanical lesions of cervical region: Secondary | ICD-10-CM | POA: Diagnosis not present

## 2013-02-15 DIAGNOSIS — M5137 Other intervertebral disc degeneration, lumbosacral region: Secondary | ICD-10-CM | POA: Diagnosis not present

## 2013-03-01 DIAGNOSIS — M5030 Other cervical disc degeneration, unspecified cervical region: Secondary | ICD-10-CM | POA: Diagnosis not present

## 2013-03-01 DIAGNOSIS — M546 Pain in thoracic spine: Secondary | ICD-10-CM | POA: Diagnosis not present

## 2013-03-01 DIAGNOSIS — M5137 Other intervertebral disc degeneration, lumbosacral region: Secondary | ICD-10-CM | POA: Diagnosis not present

## 2013-03-01 DIAGNOSIS — M9981 Other biomechanical lesions of cervical region: Secondary | ICD-10-CM | POA: Diagnosis not present

## 2013-03-01 DIAGNOSIS — M47812 Spondylosis without myelopathy or radiculopathy, cervical region: Secondary | ICD-10-CM | POA: Diagnosis not present

## 2013-03-01 DIAGNOSIS — M999 Biomechanical lesion, unspecified: Secondary | ICD-10-CM | POA: Diagnosis not present

## 2013-03-01 DIAGNOSIS — M542 Cervicalgia: Secondary | ICD-10-CM | POA: Diagnosis not present

## 2013-04-19 DIAGNOSIS — M25519 Pain in unspecified shoulder: Secondary | ICD-10-CM | POA: Diagnosis not present

## 2013-04-19 DIAGNOSIS — IMO0001 Reserved for inherently not codable concepts without codable children: Secondary | ICD-10-CM | POA: Diagnosis not present

## 2013-04-19 DIAGNOSIS — M999 Biomechanical lesion, unspecified: Secondary | ICD-10-CM | POA: Diagnosis not present

## 2013-04-19 DIAGNOSIS — M545 Low back pain: Secondary | ICD-10-CM | POA: Diagnosis not present

## 2013-04-19 DIAGNOSIS — M9981 Other biomechanical lesions of cervical region: Secondary | ICD-10-CM | POA: Diagnosis not present

## 2013-04-19 DIAGNOSIS — S46819A Strain of other muscles, fascia and tendons at shoulder and upper arm level, unspecified arm, initial encounter: Secondary | ICD-10-CM | POA: Diagnosis not present

## 2013-04-19 DIAGNOSIS — M542 Cervicalgia: Secondary | ICD-10-CM | POA: Diagnosis not present

## 2013-04-19 DIAGNOSIS — M546 Pain in thoracic spine: Secondary | ICD-10-CM | POA: Diagnosis not present

## 2013-04-20 DIAGNOSIS — M25519 Pain in unspecified shoulder: Secondary | ICD-10-CM | POA: Diagnosis not present

## 2013-04-27 DIAGNOSIS — M546 Pain in thoracic spine: Secondary | ICD-10-CM | POA: Diagnosis not present

## 2013-04-27 DIAGNOSIS — M25519 Pain in unspecified shoulder: Secondary | ICD-10-CM | POA: Diagnosis not present

## 2013-04-27 DIAGNOSIS — M999 Biomechanical lesion, unspecified: Secondary | ICD-10-CM | POA: Diagnosis not present

## 2013-04-27 DIAGNOSIS — M542 Cervicalgia: Secondary | ICD-10-CM | POA: Diagnosis not present

## 2013-04-27 DIAGNOSIS — S46819A Strain of other muscles, fascia and tendons at shoulder and upper arm level, unspecified arm, initial encounter: Secondary | ICD-10-CM | POA: Diagnosis not present

## 2013-04-27 DIAGNOSIS — IMO0001 Reserved for inherently not codable concepts without codable children: Secondary | ICD-10-CM | POA: Diagnosis not present

## 2013-04-27 DIAGNOSIS — M9981 Other biomechanical lesions of cervical region: Secondary | ICD-10-CM | POA: Diagnosis not present

## 2013-04-27 DIAGNOSIS — M545 Low back pain: Secondary | ICD-10-CM | POA: Diagnosis not present

## 2013-05-03 DIAGNOSIS — M999 Biomechanical lesion, unspecified: Secondary | ICD-10-CM | POA: Diagnosis not present

## 2013-05-03 DIAGNOSIS — S46819A Strain of other muscles, fascia and tendons at shoulder and upper arm level, unspecified arm, initial encounter: Secondary | ICD-10-CM | POA: Diagnosis not present

## 2013-05-03 DIAGNOSIS — M9981 Other biomechanical lesions of cervical region: Secondary | ICD-10-CM | POA: Diagnosis not present

## 2013-05-03 DIAGNOSIS — IMO0001 Reserved for inherently not codable concepts without codable children: Secondary | ICD-10-CM | POA: Diagnosis not present

## 2013-05-03 DIAGNOSIS — M25519 Pain in unspecified shoulder: Secondary | ICD-10-CM | POA: Diagnosis not present

## 2013-05-03 DIAGNOSIS — M545 Low back pain: Secondary | ICD-10-CM | POA: Diagnosis not present

## 2013-05-03 DIAGNOSIS — M546 Pain in thoracic spine: Secondary | ICD-10-CM | POA: Diagnosis not present

## 2013-05-03 DIAGNOSIS — M542 Cervicalgia: Secondary | ICD-10-CM | POA: Diagnosis not present

## 2013-05-10 DIAGNOSIS — M545 Low back pain: Secondary | ICD-10-CM | POA: Diagnosis not present

## 2013-05-10 DIAGNOSIS — S46819A Strain of other muscles, fascia and tendons at shoulder and upper arm level, unspecified arm, initial encounter: Secondary | ICD-10-CM | POA: Diagnosis not present

## 2013-05-10 DIAGNOSIS — M546 Pain in thoracic spine: Secondary | ICD-10-CM | POA: Diagnosis not present

## 2013-05-10 DIAGNOSIS — M9981 Other biomechanical lesions of cervical region: Secondary | ICD-10-CM | POA: Diagnosis not present

## 2013-05-10 DIAGNOSIS — M25519 Pain in unspecified shoulder: Secondary | ICD-10-CM | POA: Diagnosis not present

## 2013-05-10 DIAGNOSIS — IMO0001 Reserved for inherently not codable concepts without codable children: Secondary | ICD-10-CM | POA: Diagnosis not present

## 2013-05-10 DIAGNOSIS — M542 Cervicalgia: Secondary | ICD-10-CM | POA: Diagnosis not present

## 2013-05-10 DIAGNOSIS — M999 Biomechanical lesion, unspecified: Secondary | ICD-10-CM | POA: Diagnosis not present

## 2013-05-11 DIAGNOSIS — L821 Other seborrheic keratosis: Secondary | ICD-10-CM | POA: Diagnosis not present

## 2013-05-11 DIAGNOSIS — D239 Other benign neoplasm of skin, unspecified: Secondary | ICD-10-CM | POA: Diagnosis not present

## 2013-05-11 DIAGNOSIS — L719 Rosacea, unspecified: Secondary | ICD-10-CM | POA: Diagnosis not present

## 2013-05-11 DIAGNOSIS — L570 Actinic keratosis: Secondary | ICD-10-CM | POA: Diagnosis not present

## 2013-05-18 DIAGNOSIS — M546 Pain in thoracic spine: Secondary | ICD-10-CM | POA: Diagnosis not present

## 2013-05-18 DIAGNOSIS — IMO0001 Reserved for inherently not codable concepts without codable children: Secondary | ICD-10-CM | POA: Diagnosis not present

## 2013-05-18 DIAGNOSIS — S46819A Strain of other muscles, fascia and tendons at shoulder and upper arm level, unspecified arm, initial encounter: Secondary | ICD-10-CM | POA: Diagnosis not present

## 2013-05-18 DIAGNOSIS — M999 Biomechanical lesion, unspecified: Secondary | ICD-10-CM | POA: Diagnosis not present

## 2013-05-18 DIAGNOSIS — M25519 Pain in unspecified shoulder: Secondary | ICD-10-CM | POA: Diagnosis not present

## 2013-05-18 DIAGNOSIS — M9981 Other biomechanical lesions of cervical region: Secondary | ICD-10-CM | POA: Diagnosis not present

## 2013-05-18 DIAGNOSIS — M542 Cervicalgia: Secondary | ICD-10-CM | POA: Diagnosis not present

## 2013-05-18 DIAGNOSIS — M545 Low back pain: Secondary | ICD-10-CM | POA: Diagnosis not present

## 2013-05-24 DIAGNOSIS — M9981 Other biomechanical lesions of cervical region: Secondary | ICD-10-CM | POA: Diagnosis not present

## 2013-05-24 DIAGNOSIS — M546 Pain in thoracic spine: Secondary | ICD-10-CM | POA: Diagnosis not present

## 2013-05-24 DIAGNOSIS — M542 Cervicalgia: Secondary | ICD-10-CM | POA: Diagnosis not present

## 2013-05-24 DIAGNOSIS — S46819A Strain of other muscles, fascia and tendons at shoulder and upper arm level, unspecified arm, initial encounter: Secondary | ICD-10-CM | POA: Diagnosis not present

## 2013-05-24 DIAGNOSIS — IMO0001 Reserved for inherently not codable concepts without codable children: Secondary | ICD-10-CM | POA: Diagnosis not present

## 2013-05-24 DIAGNOSIS — M25519 Pain in unspecified shoulder: Secondary | ICD-10-CM | POA: Diagnosis not present

## 2013-05-24 DIAGNOSIS — M999 Biomechanical lesion, unspecified: Secondary | ICD-10-CM | POA: Diagnosis not present

## 2013-05-24 DIAGNOSIS — M545 Low back pain: Secondary | ICD-10-CM | POA: Diagnosis not present

## 2013-05-31 DIAGNOSIS — M546 Pain in thoracic spine: Secondary | ICD-10-CM | POA: Diagnosis not present

## 2013-05-31 DIAGNOSIS — M542 Cervicalgia: Secondary | ICD-10-CM | POA: Diagnosis not present

## 2013-05-31 DIAGNOSIS — S46819A Strain of other muscles, fascia and tendons at shoulder and upper arm level, unspecified arm, initial encounter: Secondary | ICD-10-CM | POA: Diagnosis not present

## 2013-05-31 DIAGNOSIS — M545 Low back pain: Secondary | ICD-10-CM | POA: Diagnosis not present

## 2013-05-31 DIAGNOSIS — IMO0001 Reserved for inherently not codable concepts without codable children: Secondary | ICD-10-CM | POA: Diagnosis not present

## 2013-05-31 DIAGNOSIS — M999 Biomechanical lesion, unspecified: Secondary | ICD-10-CM | POA: Diagnosis not present

## 2013-05-31 DIAGNOSIS — M9981 Other biomechanical lesions of cervical region: Secondary | ICD-10-CM | POA: Diagnosis not present

## 2013-05-31 DIAGNOSIS — M25519 Pain in unspecified shoulder: Secondary | ICD-10-CM | POA: Diagnosis not present

## 2013-06-13 DIAGNOSIS — M25519 Pain in unspecified shoulder: Secondary | ICD-10-CM | POA: Diagnosis not present

## 2013-06-13 DIAGNOSIS — M546 Pain in thoracic spine: Secondary | ICD-10-CM | POA: Diagnosis not present

## 2013-06-13 DIAGNOSIS — S46819A Strain of other muscles, fascia and tendons at shoulder and upper arm level, unspecified arm, initial encounter: Secondary | ICD-10-CM | POA: Diagnosis not present

## 2013-06-13 DIAGNOSIS — M542 Cervicalgia: Secondary | ICD-10-CM | POA: Diagnosis not present

## 2013-06-13 DIAGNOSIS — M999 Biomechanical lesion, unspecified: Secondary | ICD-10-CM | POA: Diagnosis not present

## 2013-06-13 DIAGNOSIS — M9981 Other biomechanical lesions of cervical region: Secondary | ICD-10-CM | POA: Diagnosis not present

## 2013-06-13 DIAGNOSIS — IMO0001 Reserved for inherently not codable concepts without codable children: Secondary | ICD-10-CM | POA: Diagnosis not present

## 2013-06-13 DIAGNOSIS — M545 Low back pain: Secondary | ICD-10-CM | POA: Diagnosis not present

## 2013-06-23 ENCOUNTER — Ambulatory Visit: Payer: Medicare Other | Admitting: Internal Medicine

## 2013-07-18 DIAGNOSIS — H26499 Other secondary cataract, unspecified eye: Secondary | ICD-10-CM | POA: Diagnosis not present

## 2013-07-18 DIAGNOSIS — Z961 Presence of intraocular lens: Secondary | ICD-10-CM | POA: Diagnosis not present

## 2013-07-19 ENCOUNTER — Encounter: Payer: Self-pay | Admitting: Internal Medicine

## 2013-07-19 ENCOUNTER — Telehealth: Payer: Self-pay | Admitting: Internal Medicine

## 2013-07-19 ENCOUNTER — Ambulatory Visit (INDEPENDENT_AMBULATORY_CARE_PROVIDER_SITE_OTHER): Payer: Medicare Other | Admitting: Internal Medicine

## 2013-07-19 ENCOUNTER — Ambulatory Visit
Admission: RE | Admit: 2013-07-19 | Discharge: 2013-07-19 | Disposition: A | Discharge status: Home / Self Care | Payer: Medicare Other | Source: Ambulatory Visit | Attending: Internal Medicine | Admitting: Internal Medicine

## 2013-07-19 VITALS — BP 112/78 | HR 82 | Ht 65.0 in | Wt 132.4 lb

## 2013-07-19 DIAGNOSIS — J40 Bronchitis, not specified as acute or chronic: Secondary | ICD-10-CM | POA: Diagnosis not present

## 2013-07-19 DIAGNOSIS — Z23 Encounter for immunization: Secondary | ICD-10-CM

## 2013-07-19 DIAGNOSIS — F17200 Nicotine dependence, unspecified, uncomplicated: Secondary | ICD-10-CM | POA: Diagnosis not present

## 2013-07-19 DIAGNOSIS — Z8701 Personal history of pneumonia (recurrent): Secondary | ICD-10-CM

## 2013-07-19 DIAGNOSIS — R490 Dysphonia: Secondary | ICD-10-CM | POA: Diagnosis not present

## 2013-07-19 NOTE — Patient Instructions (Signed)
Order- CXR-  Bronchitis  Pneumococcal conjugate vaccine Prevnar 13   Please keep in mind that you Can quit smoking and will be better off if you do  Please call as needed

## 2013-07-19 NOTE — Progress Notes (Signed)
06/24/11- 72 year old female active smoker followed for lung nodule, bronchitis, tobacco use. Last here- 06/25/2010. She is still smoking and not interested in trying to quit. She declines flu vaccine. She did not want her blood pressure checked but finally agreed to let me check it as long as she could continue to wear her sweater over her arm. She asks refills of her routine meds which were reviewed. Takes occasional Sudafed for nasal congestion. Would like to have Biaxin available. Has a rescue inhaler she uses only rarely. Denies wheeze chest tightness routine cough or phlegm bloody or purulent sputum chest pain palpitations or swollen glands. Pending varicose vein surgery.  06/22/12- 72 year old female active smoker followed for lung nodule, bronchitis, tobacco use. Last here- 06/25/2010. 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 Her continued smoking is likely to irritate her cords as well-discussed with her. She took prednisone and antibiotic for a summer cold. Continue Sudafed and antihistamines each morning and one half antihistamine( clemastine) at bedtime  CXR 06/26/11- reviewed with her IMPRESSION:  COPD. No active cardiopulmonary disease.  Original Report Authenticated By: Cyndie Chime, M.D.   07/19/13- 72 year old female active smoker followed for lung nodule, bronchitis, tobacco use. FOLLOWS FOR: had 4 month long sore throat-went to ENT and was told  she had strained vocal cords and to rest voice-this worked. Otherwise no SOB, wheezing, cough, or congestion. She declines flu vaccine and has made no effort to stop smoking No acute respiratory events in the past year. Denies reflux. Has held a prednisone taper prescription but not filled. Dieted off 20 pounds for her son's wedding  (anesthesiologist in [place]) She did not get the chest x-ray we planned after last visit  ROS-see HPI Constitutional:   No-   weight loss, night sweats, fevers, chills, fatigue, lassitude. HEENT:   No-  headaches, difficulty swallowing, tooth/dental problems, sore throat,       No-  sneezing, itching, ear ache, +nasal congestion, post nasal drip,  CV:  No-   chest pain, orthopnea, PND, swelling in lower extremities, anasarca, dizziness, palpitations Resp: No-   shortness of breath with exertion or at rest.              No-   productive cough,  No non-productive cough,  No-  coughing up of blood.              No-   change in color of mucus.  No- wheezing.   Skin: No-   rash or lesions. GI:  No-   heartburn, indigestion, abdominal pain, nausea, vomiting,  GU:  MS:  No-   joint pain or swelling.   Neuro- nothing unusual Psych:  No- change in mood or affect. No depression or anxiety.  No memory loss.   OBJ  General- Alert, Oriented, Affect-appropriate, Distress- none acute  She is trim, Talkative Skin- rash-none, lesions- none, excoriation- none Lymphadenopathy- none Head- atraumatic            Eyes- Gross vision intact, PERRLA, conjunctivae clear secretions            Ears- Hearing, canals-normal            Nose- Clear, no-Septal dev, mucus, polyps, erosion, perforation             Throat- Mallampati II-III , mucosa clear ,  drainage- none, tonsils- atrophic Neck- flexible , trachea midline, no stridor , thyroid nl, carotid no bruit Chest - symmetrical excursion , unlabored           Heart/CV- RRR , no murmur , no gallop  , no rub, nl s1 s2                           - JVD- none , edema- none, stasis changes- none, varices- none           Lung- clear to P&A, wheeze- none, cough- none , dullness-none, rub- none           Chest wall-  Abd-  Br/ Gen/ Rectal- Not done, not indicated Extrem- cyanosis- none, clubbing, none, atrophy- none, strength- nl Neuro- grossly intact to  observation

## 2013-07-19 NOTE — Telephone Encounter (Signed)
Spoke with pt and advised her she did not need an appt for CXR since it was already ordered. Nothing further needed

## 2013-08-03 ENCOUNTER — Encounter: Payer: Self-pay | Admitting: Internal Medicine

## 2013-08-03 DIAGNOSIS — H26499 Other secondary cataract, unspecified eye: Secondary | ICD-10-CM | POA: Diagnosis not present

## 2013-08-03 NOTE — Assessment & Plan Note (Signed)
Laryngoscopy by ENT reported inflamed vocal cords and she improved with voice rest. Smoking cessation would help but she is not willing to try.

## 2013-08-03 NOTE — Assessment & Plan Note (Signed)
Despite my counseling efforts she is quite closed on this issue and making no effort to stop smoking

## 2013-08-03 NOTE — Assessment & Plan Note (Signed)
Educated on [place] and she accepts

## 2013-09-27 DIAGNOSIS — M9981 Other biomechanical lesions of cervical region: Secondary | ICD-10-CM | POA: Diagnosis not present

## 2013-09-27 DIAGNOSIS — IMO0001 Reserved for inherently not codable concepts without codable children: Secondary | ICD-10-CM | POA: Diagnosis not present

## 2013-09-27 DIAGNOSIS — M545 Low back pain, unspecified: Secondary | ICD-10-CM | POA: Diagnosis not present

## 2013-09-27 DIAGNOSIS — N63 Unspecified lump in unspecified breast: Secondary | ICD-10-CM | POA: Diagnosis not present

## 2013-09-27 DIAGNOSIS — F17200 Nicotine dependence, unspecified, uncomplicated: Secondary | ICD-10-CM | POA: Diagnosis not present

## 2013-09-27 DIAGNOSIS — M546 Pain in thoracic spine: Secondary | ICD-10-CM | POA: Diagnosis not present

## 2013-09-27 DIAGNOSIS — M999 Biomechanical lesion, unspecified: Secondary | ICD-10-CM | POA: Diagnosis not present

## 2013-09-27 DIAGNOSIS — M542 Cervicalgia: Secondary | ICD-10-CM | POA: Diagnosis not present

## 2013-09-28 DIAGNOSIS — D0590 Unspecified type of carcinoma in situ of unspecified breast: Secondary | ICD-10-CM | POA: Diagnosis not present

## 2013-09-28 DIAGNOSIS — N63 Unspecified lump in unspecified breast: Secondary | ICD-10-CM | POA: Diagnosis not present

## 2013-09-28 DIAGNOSIS — C50919 Malignant neoplasm of unspecified site of unspecified female breast: Secondary | ICD-10-CM | POA: Diagnosis not present

## 2013-10-05 ENCOUNTER — Ambulatory Visit (INDEPENDENT_AMBULATORY_CARE_PROVIDER_SITE_OTHER): Payer: Medicare Other | Admitting: General Surgery

## 2013-10-05 VITALS — BP 122/78 | HR 71 | Temp 97.3°F | Resp 16 | Ht 66.5 in | Wt 130.8 lb

## 2013-10-05 DIAGNOSIS — C50419 Malignant neoplasm of upper-outer quadrant of unspecified female breast: Secondary | ICD-10-CM | POA: Diagnosis not present

## 2013-10-05 NOTE — Progress Notes (Signed)
Chief Complaint: New diagnosis of breast cancer  History:    Crystal Hill is a 73 y.o. postmenopausal female referred by Dr. 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 She recently noted a mass in the lateral aspect of her left breast approximately 2 weeks ago.   She has a history of fibrocystic breast disease in her 14s and apparently had multiple repeat mammograms at that time and since then has decided and adamantly refuses to have any further mammograms. She presented and therefore initial imaging was ultrasound revealing a 2.2 cm hypoechoic mass superficially in the 9:00 position of the left breast. 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 She is seen now in the office for initial treatment planning.  She has experienced a breast lump on exam and and soreness just since the biopsy..  She does have a personal history of  previous breast problems fibrocystic disease as above..  Findings at that time were the following:  Tumor size: 2.2 cm  Tumor grade: 1  Estrogen Receptor: positive Progesterone Receptor: positive  Her-2 neu: negative  Lymph node status: negative Neurovascular invasion: no comment Lymphatic invasion: no comment  Past Medical History  Diagnosis Date  . Pulmonary nodule   . Chronic sinusitis   . History of recurrent pneumonia   . Tobacco abuse   . Nephrolithiasis     Past Surgical History  Procedure Laterality Date  . Partial hysterectomy    . Tubal ligation      Current Outpatient Prescriptions  Medication Sig Dispense Refill  . Azelaic Acid (FINACEA) 15 % cream As directed       . azelastine (ASTELIN) 137 MCG/SPRAY nasal spray Place 1 spray into the nose 2 (two) times daily. Use in each nostril as directed       . cholecalciferol (VITAMIN D) 1000 UNITS tablet 2 tablets daily      . Clemastine Fumarate 1.34 MG TABS 1/2 to 1 tablet daily        . Docosahexaenoic Acid (DHA COMPLETE PO) 800 mg daily       . estradiol (ESTRACE) 1 MG tablet Take 1 mg by mouth daily.        . Lactobacillus (ACIDOPHILUS) CAPS Once a day        . Lysine 1000 MG TABS Once a day       . medroxyPROGESTERone (PROVERA) 2.5 MG tablet Take 2.5 mg by mouth daily.        . mometasone (NASONEX) 50 MCG/ACT nasal spray Place 2 sprays into the nose daily.        . Multiple Vitamin (MULTIVITAMIN) tablet Take 1 tablet by mouth daily.        . Omega-3 Fatty Acids (EPA PO) 1600 mg daily       . pseudoephedrine (SUDAFED) 30 MG tablet Take 30 mg by mouth every 4 (four) hours as needed.        . vitamin C (ASCORBIC ACID) 500 MG tablet Take 500 mg by mouth daily.        . zaleplon (SONATA) 10 MG capsule Take 10 mg by mouth at bedtime.         No current facility-administered medications for this visit.    Family History  Problem Relation Age of Onset  . Cancer      History   Social History  . Marital Status: Widowed    Spouse Name: N/A  Number of Children: N/A  . Years of Education: N/A   Social History Main Topics  . Smoking status: Current Every Day Smoker -- 2.00 packs/day    Types: Cigarettes  . Smokeless tobacco: Never Used  . Alcohol Use: Not on file  . Drug Use: Not on file  . Sexual Activity: Not on file   Other Topics Concern  . Not on file   Social History Narrative     She lives alone and cares for her 46 year old mother who is under hospice care.   Review of Systems A comprehensive review of systems was negative.     Objective:  BP 122/78  Pulse 71  Temp(Src) 97.3 F (36.3 C) (Temporal)  Resp 16  Ht 5' 6.5" (1.689 m)  Wt 130 lb 12.8 oz (59.33 kg)  BMI 20.80 kg/m2  General: Alert, well-developed a thin Caucasian female, in no distress Skin: Warm and dry without rash or infection. HEENT: No palpable masses or thyromegaly. Sclera nonicteric. Pupils equal round and reactive. Oropharynx clear. Breasts: there is some bruising and  thickening of the outer left breast post biopsy. There is a likely mass at the 9:00 position but this is somewhat difficult to determine due to due postbiopsy swelling. I cannot feel any axillary lymph nodes. Lymph nodes: No cervical, supraclavicular, or inguinal nodes palpable. Lungs: Breath sounds clear and equal without increased work of breathing Cardiovascular: Regular rate and rhythm without murmur. No JVD or edema. Peripheral pulses intact. Abdomen: Nondistended. Soft and nontender. No masses palpable. No organomegaly. No palpable hernias. Extremities: No edema or joint swelling or deformity. No chronic venous stasis changes. Neurologic: Alert and fully oriented. Gait normal.   Laboratory data:  CBC:  Lab Results  Component Value Date   WBC 5.2 03/21/2011   RBC 4.46 03/21/2011   HGB 15.6* 03/21/2011   HCT 46.0 03/21/2011   PLT 122* 03/21/2011  ]  [facility] Labs:  Lab Results  Component Value Date   NA 142 03/21/2011   K 3.8 03/21/2011   CL 106 03/21/2011   CO2 25 03/01/2009   BUN 12 03/21/2011   CREATININE 0.90 03/21/2011   CALCIUM 9.8 03/01/2009   PROT 7.0 03/21/2011   BILITOT 0.2* 03/21/2011   BILIDIR 0.1 03/21/2011   ALKPHOS 95 03/21/2011   AST 28 03/21/2011   ALT 31 03/21/2011     Assessment  73 y.o. female with a new diagnosis of cancer of the the left breast upper outer quadrant.  Clinical IIA, estrogen receptor positive. I discussed with the patient and family members present today initial surgical treatment options. We discussed options of breast conservation with lumpectomy or total mastectomy and sentinal lymph node biopsy/dissection. Options for reconstruction were discussed. After discussion they have elected to proceed with left partial mastectomy.  We discussed the indications and nature of the procedure, and expected recovery, in detail. Surgical risks including anesthetic complications, cardiorespiratory complications, bleeding, infection, wound healing complications, blood clots, lymphedema,  local and distant recurrence and possible need for further surgery based on the final pathology was discussed and understood. We discussed the that standard evaluation would include mammography but she flatly refuses mammograms and understands that we could miss other cancers. Chemotherapy, hormonal therapy and radiation therapy have been discussed. They have been provided with literature regarding the treatment of breast cancer.  All questions were answered. They understand and agree to proceed and we will go ahead with scheduling.  Plan right partial mastectomy with axillary sentinel lymph node biopsy as  an outpatient under general anesthesia. We will proceed with bilateral MRI that we discussed could alter our current treatment plan. I will call her with the results and we will arrange scheduling.  Edward Jolly MD, FACS  10/05/2013, 3:54 PM

## 2013-10-05 NOTE — Patient Instructions (Signed)
Stop Estrase and Provera

## 2013-10-06 ENCOUNTER — Other Ambulatory Visit (INDEPENDENT_AMBULATORY_CARE_PROVIDER_SITE_OTHER): Payer: Self-pay | Admitting: General Surgery

## 2013-10-06 ENCOUNTER — Telehealth (INDEPENDENT_AMBULATORY_CARE_PROVIDER_SITE_OTHER): Payer: Self-pay | Admitting: *Deleted

## 2013-10-06 ENCOUNTER — Telehealth (INDEPENDENT_AMBULATORY_CARE_PROVIDER_SITE_OTHER): Payer: Self-pay | Admitting: General Surgery

## 2013-10-06 DIAGNOSIS — C50919 Malignant neoplasm of unspecified site of unspecified female breast: Secondary | ICD-10-CM

## 2013-10-06 NOTE — Telephone Encounter (Signed)
[facility] has not record of where pt was diagnosis.  Please forward all information and call to let them know where films were done?  She has appt with them 10/13/13

## 2013-10-06 NOTE — Telephone Encounter (Signed)
I spoke with pt and informed her of appt for her breast MRI at [facility] on 10/12/13 with an arrival time of 7:30am.  She states she would like to get in sooner and I informed her that she is more than welcomed to call periodically and see if they have any cancellations.  She is agreeable with this plan.

## 2013-10-06 NOTE — Telephone Encounter (Signed)
This was at [facility]. She refused mammograms and had only ultrasound. She was presented and breast cancer conference this week. Please forward this to them

## 2013-10-10 ENCOUNTER — Encounter (INDEPENDENT_AMBULATORY_CARE_PROVIDER_SITE_OTHER): Payer: Self-pay

## 2013-10-10 ENCOUNTER — Telehealth (INDEPENDENT_AMBULATORY_CARE_PROVIDER_SITE_OTHER): Payer: Self-pay | Admitting: *Deleted

## 2013-10-10 ENCOUNTER — Other Ambulatory Visit (INDEPENDENT_AMBULATORY_CARE_PROVIDER_SITE_OTHER): Payer: Self-pay

## 2013-10-10 DIAGNOSIS — C50419 Malignant neoplasm of upper-outer quadrant of unspecified female breast: Secondary | ICD-10-CM

## 2013-10-10 NOTE — Telephone Encounter (Signed)
LMOM for pt to return my call.  I was calling to make her aware that Dr. Excell Seltzer has made referrals to med and rad oncology as she requested.  They will contact her directly to schedule the appts.

## 2013-10-11 NOTE — Telephone Encounter (Signed)
Pt returned call and given message about her referrals.

## 2013-10-12 ENCOUNTER — Ambulatory Visit
Admission: RE | Admit: 2013-10-12 | Discharge: 2013-10-12 | Disposition: A | Discharge status: Home / Self Care | Payer: Medicare Other | Source: Ambulatory Visit | Attending: General Surgery | Admitting: General Surgery

## 2013-10-12 DIAGNOSIS — C50919 Malignant neoplasm of unspecified site of unspecified female breast: Secondary | ICD-10-CM | POA: Diagnosis not present

## 2013-10-12 DIAGNOSIS — C50419 Malignant neoplasm of upper-outer quadrant of unspecified female breast: Secondary | ICD-10-CM

## 2013-10-12 MED ORDER — GADOBENATE DIMEGLUMINE 529 MG/ML IV SOLN
12.0000 mL | Freq: Once | INTRAVENOUS | Status: AC | PRN
  Administered 2013-10-12: 12 mL via INTRAVENOUS

## 2013-10-13 ENCOUNTER — Telehealth: Payer: Self-pay | Admitting: *Deleted

## 2013-10-13 NOTE — Telephone Encounter (Signed)
Received appt date and time back from Dr. Humphrey Rolls and I left a message for the pt to return my call so I can schedule her.

## 2013-10-14 ENCOUNTER — Telehealth: Payer: Self-pay | Admitting: *Deleted

## 2013-10-14 NOTE — Telephone Encounter (Signed)
Pt returned my call and I confirmed 10/18/13 appt.  Mailed before appt letter, welcome packet & intake form to pt.  Emailed Christy at [employer] to make her aware.  Took paperwork to [facility] for chart.

## 2013-10-17 ENCOUNTER — Encounter: Payer: Self-pay | Admitting: *Deleted

## 2013-10-17 ENCOUNTER — Other Ambulatory Visit: Payer: Self-pay | Admitting: *Deleted

## 2013-10-17 DIAGNOSIS — C50412 Malignant neoplasm of upper-outer quadrant of left female breast: Secondary | ICD-10-CM | POA: Insufficient documentation

## 2013-10-17 NOTE — Progress Notes (Signed)
Received chart back from [facility].  Added to spreadsheet & placed in Dr. Laurelyn Sickle box.

## 2013-10-17 NOTE — Progress Notes (Signed)
Completed chart and gave to [facility] to enter labs and return chart to me.

## 2013-10-18 ENCOUNTER — Ambulatory Visit (HOSPITAL_BASED_OUTPATIENT_CLINIC_OR_DEPARTMENT_OTHER): Payer: Medicare Other | Admitting: Oncology

## 2013-10-18 ENCOUNTER — Other Ambulatory Visit (HOSPITAL_BASED_OUTPATIENT_CLINIC_OR_DEPARTMENT_OTHER): Payer: Medicare Other

## 2013-10-18 ENCOUNTER — Ambulatory Visit: Payer: Medicare Other

## 2013-10-18 ENCOUNTER — Telehealth (INDEPENDENT_AMBULATORY_CARE_PROVIDER_SITE_OTHER): Payer: Self-pay | Admitting: General Surgery

## 2013-10-18 ENCOUNTER — Encounter: Payer: Self-pay | Admitting: Oncology

## 2013-10-18 ENCOUNTER — Other Ambulatory Visit: Payer: Self-pay | Admitting: *Deleted

## 2013-10-18 VITALS — BP 147/70 | HR 76 | Temp 98.1°F | Resp 18 | Ht 66.5 in | Wt 131.2 lb

## 2013-10-18 DIAGNOSIS — C50419 Malignant neoplasm of upper-outer quadrant of unspecified female breast: Secondary | ICD-10-CM

## 2013-10-18 DIAGNOSIS — Z170 Estrogen receptor positive status [ER+]: Secondary | ICD-10-CM

## 2013-10-18 DIAGNOSIS — F17200 Nicotine dependence, unspecified, uncomplicated: Secondary | ICD-10-CM

## 2013-10-18 LAB — CBC WITH DIFFERENTIAL/PLATELET
BASO%: 1.1 % (ref 0.0–2.0)
Basophils Absolute: 0.1 10*3/uL (ref 0.0–0.1)
EOS%: 4.2 % (ref 0.0–7.0)
Eosinophils Absolute: 0.2 10*3/uL (ref 0.0–0.5)
HCT: 43.9 % (ref 34.8–46.6)
HGB: 14.7 g/dL (ref 11.6–15.9)
LYMPH#: 1.5 10*3/uL (ref 0.9–3.3)
LYMPH%: 27.4 % (ref 14.0–49.7)
MCH: 32.8 pg (ref 25.1–34.0)
MCHC: 33.6 g/dL (ref 31.5–36.0)
MCV: 97.6 fL (ref 79.5–101.0)
MONO#: 0.5 10*3/uL (ref 0.1–0.9)
MONO%: 9.3 % (ref 0.0–14.0)
NEUT#: 3.1 10*3/uL (ref 1.5–6.5)
NEUT%: 58 % (ref 38.4–76.8)
Platelets: 191 10*3/uL (ref 145–400)
RBC: 4.5 10*6/uL (ref 3.70–5.45)
RDW: 14 % (ref 11.2–14.5)
WBC: 5.4 10*3/uL (ref 3.9–10.3)

## 2013-10-18 LAB — COMPREHENSIVE METABOLIC PANEL (CC13)
ALBUMIN: 3.7 g/dL (ref 3.5–5.0)
ALK PHOS: 82 U/L (ref 40–150)
ALT: 17 U/L (ref 0–55)
AST: 18 U/L (ref 5–34)
Anion Gap: 9 mEq/L (ref 3–11)
BUN: 12.3 mg/dL (ref 7.0–26.0)
CALCIUM: 10.2 mg/dL (ref 8.4–10.4)
CHLORIDE: 105 meq/L (ref 98–109)
CO2: 27 mEq/L (ref 22–29)
Creatinine: 0.8 mg/dL (ref 0.6–1.1)
Glucose: 140 mg/dl (ref 70–140)
POTASSIUM: 3.7 meq/L (ref 3.5–5.1)
Sodium: 141 mEq/L (ref 136–145)
Total Bilirubin: 0.43 mg/dL (ref 0.20–1.20)
Total Protein: 7.1 g/dL (ref 6.4–8.3)

## 2013-10-18 NOTE — Telephone Encounter (Signed)
Called pt and discussed MRI results

## 2013-10-18 NOTE — Progress Notes (Signed)
Crystal Hill [id number] 04/10/1941 73 y.o. 10/18/2013 3:23 PM  CC  Donnajean Lopes, MD [address] [place] [address] Dr. Excell Seltzer Dr. Thea Silversmith  REASON FOR CONSULTATION:  73 year old female with new diagnosis of ER positive left breast cancer diagnosed January 2015  STAGE:   Cancer of upper-outer quadrant of female breast   Primary site: Breast   Staging method: AJCC 7th Edition   Clinical: Stage IIA (T2, N0, cM0)   Summary: Stage IIA (T2, N0, cM0)  REFERRING PHYSICIAN: Dr. Excell Seltzer  HISTORY OF PRESENT ILLNESS:  Crystal Hill is a 73 y.o. female.  With past medical history significant for tobacco abuse nephrolithiasis recurrent pneumonias pulmonary embolism and chronic sinusitis. Patient does not get mammograms performed. Most recently she noted a mass in the lateral aspect of left breast. She does have a history of fibrocystic breast disease. Patient had an ultrasound performed of the mass that revealed a 2.2 centimeter hypoechoic mass in the 9:00 position. 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 The tumor was estrogen receptor positive progesterone receptor positive HER-2/neu negative, grade 1 with proliferation marker Ki-67 7%. Patient was seen by Dr. Excell Seltzer. She also has had MRI of the breasts performed on 10/12/2013. The MRI revealed in the left breast irregular enhancing mass in the outer left breast compatible with known biopsy-proven malignancy measuring 2.1 cm no additional suspicious enhancing masses or areas of enhancement to suggest any other findings lymph nodes were negative morphologically. No internal mammary or lymphadenopathy was noted. Patient is interested in lumpectomy with MammoSite radiation. She today is without any complaints except for her sinuses. She was not accompanied by any one today. She is without any  other complaints.   Past Medical History: Past Medical History  Diagnosis Date  . Pulmonary nodule   . Chronic sinusitis   . History of recurrent pneumonia   . Tobacco abuse   . Nephrolithiasis   . Breast cancer     Past Surgical History: Past Surgical History  Procedure Laterality Date  . Partial hysterectomy    . Tubal ligation      Family History: Family History  Problem Relation Age of Onset  . Cancer      Social History History  Substance Use Topics  . Smoking status: Current Every Day Smoker -- 2.00 packs/day    Types: Cigarettes  . Smokeless tobacco: Never Used  . Alcohol Use: No    Allergies: Allergies  Allergen Reactions  . Codeine Palpitations    Heart palpitations "like I'm having a heart attack"  . Penicillins Itching  . Sulfonamide Derivatives Other (See Comments)    Blisters from mouth all the way down GI tract.    Current Medications: Current Outpatient Prescriptions  Medication Sig Dispense Refill  . Azelaic Acid (FINACEA) 15 % cream As directed       . azelastine (ASTELIN) 137 MCG/SPRAY nasal spray Place 1 spray into the nose 2 (two) times daily. Use in each nostril as directed       . cholecalciferol (VITAMIN D) 1000 UNITS tablet 2 tablets daily      . Clemastine Fumarate 1.34 MG TABS 1/2 to 1 tablet daily       . Docosahexaenoic Acid (DHA COMPLETE PO) 800 mg daily       . estradiol (ESTRACE) 1 MG tablet Take 1 mg by mouth daily.        Marland Kitchen  Lactobacillus (ACIDOPHILUS) CAPS Once a day        . Lysine 1000 MG TABS Once a day       . medroxyPROGESTERone (PROVERA) 2.5 MG tablet Take 2.5 mg by mouth daily.        . mometasone (NASONEX) 50 MCG/ACT nasal spray Place 2 sprays into the nose daily.        . Multiple Vitamin (MULTIVITAMIN) tablet Take 1 tablet by mouth daily.        . Omega-3 Fatty Acids (EPA PO) 1600 mg daily       . pseudoephedrine (SUDAFED) 30 MG tablet Take 30 mg by mouth every 4 (four) hours as needed.        . venlafaxine XR  (EFFEXOR-XR) 75 MG 24 hr capsule       . vitamin C (ASCORBIC ACID) 500 MG tablet Take 500 mg by mouth daily.        . zaleplon (SONATA) 10 MG capsule Take 10 mg by mouth at bedtime.         No current facility-administered medications for this visit.    OB/GYN History:menarche at age 3 she underwent menopause in 1977 she had been on hormone replacement therapy for 35 years she discontinued it 2 weeks ago at the time of diagnosis. She's had to live births first live birth at 43  Fertility Discussion: not applicable Prior History of Cancer: no  Health Maintenance:  Colonoscopy 2012 Bone Density about 10 years ago Last PAP smear I know  ECOG PERFORMANCE STATUS: 0 - Asymptomatic  Genetic Counseling/testing: yes  REVIEW OF SYSTEMS:  A comprehensive review of systems was negative.  PHYSICAL EXAMINATION: Blood pressure 147/70, pulse 76, temperature 98.1 F (36.7 C), temperature source Oral, resp. rate 18, height 5' 6.5" (1.689 m), weight 131 lb 3.2 oz (59.512 kg).  General:  well-nourished in no acute distress.  Eyes:  no scleral icterus.  ENT:  There were no oropharyngeal lesions.  Neck was without thyromegaly.  Lymphatics:  Negative cervical, supraclavicular or axillary adenopathy.  Respiratory: lungs were clear bilaterally without wheezing or crackles.  Cardiovascular:  Regular rate and rhythm, S1/S2, without murmur, rub or gallop.  There was no pedal edema.  GI:  abdomen was soft, flat, nontender, nondistended, without organomegaly.  Muscoloskeletal:  no spinal tenderness of palpation of vertebral spine.  Skin exam was without echymosis, petichae.  Neuro exam was nonfocal.  Patient was able to get on and off exam table without assistance.  Gait was normal.  Patient was alerted and oriented.  Attention was good.   Language was appropriate.  Mood was normal without depression.  Speech was not pressured.  Thought content was not tangential.   Breasts: breasts appear normal, no suspicious  masses, no skin or nipple changes or axillary nodes, right breast normal without mass, skin or nipple changes or axillary nodes, abnormal mass palpable in the left breast at about the 3:00 position..   STUDIES/RESULTS: Mr Breast Bilateral W Wo Contrast  10/12/2013   CLINICAL DATA:  73 year old female with recently diagnosed invasive ductal carcinoma of the left breast at 3 o'clock, with a 2.2 cm solid mass seen sonographically.  EXAM: BILATERAL BREAST MRI WITH AND WITHOUT CONTRAST  LABS:  BUN and creatinine were obtained on site at [facility] at  [address]  Results:  BUN 13 mg/dL,  Creatinine 0.7 mg/dL.  TECHNIQUE: Multiplanar, multisequence MR images of both breasts were obtained prior to and following the intravenous administration of  52m of MultiHance.  THREE-DIMENSIONAL MR IMAGE RENDERING ON INDEPENDENT WORKSTATION:  Three-dimensional MR images were rendered by post-processing of the original MR data on an independent workstation. The three-dimensional MR images were interpreted, and findings are reported in the following complete MRI report for this study. Three dimensional images were evaluated at the independent DynaCad workstation  COMPARISON:  Previous left breast ultrasound from 09/28/2013. No mammograms available (the patient had refused mammography).  FINDINGS: Breast composition: c:  Heterogeneous fibroglandular tissue  Background parenchymal enhancement: Mild, with multiple small scattered enhancing foci which are favored to be benign given the multiplicity and bilaterality.  Right breast: No suspicious rapidly enhancing masses or abnormal areas of enhancement in the right breast to suggest malignancy.  Left breast: Irregular enhancing mass in the outer left breast compatible with known biopsy proven malignancy measures 2.1 cm AP, 1.1 cm transverse, and 1.4 cm craniocaudal. No additional suspicious enhancing masses or suspicious areas of enhancement seen to suggest multifocal  or multicentric disease. No findings to suggest skin, nipple, or pectoralis involvement.  Lymph nodes: No morphologically abnormal axillary lymph nodes. No internal mammary lymphadenopathy.  Ancillary findings:  None.  IMPRESSION: 1. Biopsy proven malignancy in the outer left breast measuring up to 2.1 cm. No findings to suggest multifocal or multicentric disease.  2. No MRI evidence of malignancy in the right breast.  RECOMMENDATION: Treatment plan for known left breast malignancy.  BI-RADS CATEGORY  6: Known biopsy-proven malignancy - appropriate action should be taken.   Electronically Signed   By: JEverlean AlstromM.D.   On: 10/12/2013 12:10     LABS:    Chemistry      Component Value Date/Time   NA 142 03/21/2011 1820   K 3.8 03/21/2011 1820   CL 106 03/21/2011 1820   CO2 25 03/01/2009 1005   BUN 12 03/21/2011 1820   CREATININE 0.90 03/21/2011 1820      Component Value Date/Time   CALCIUM 9.8 03/01/2009 1005   ALKPHOS 95 03/21/2011 1753   AST 28 03/21/2011 1753   ALT 31 03/21/2011 1753   BILITOT 0.2* 03/21/2011 1753      Lab Results  Component Value Date   WBC 5.4 10/18/2013   HGB 14.7 10/18/2013   HCT 43.9 10/18/2013   MCV 97.6 10/18/2013   PLT 191 10/18/2013   PATHOLOGY  ASSESSMENT/PLAN    73year old female with  #1 stage II (T2 Nx) invasive ductal carcinoma, grade 1 ER positive PR positive HER-2/neu negative with a low proliferation marker Ki-67 of 7%. Patient is interested in breast conservation. She would like to have a lumpectomy with sentinel lymph node biopsy and MammoSite. Patient has been seen by Dr. BExcell Seltzer She has an appointment set up to be seen by Dr. SThea Silversmithon 10/20/2013.   #2 We spent the better part of today's hour-long appointment discussing the biology of breast cancer in general, and the specifics of the patient's tumor in particular.I went over with the patient the pathology and the radiology findings. We discussed treatment options including surgery with  lumpectomy versus a mastectomy with sentinel lymph node biopsy. We discussed the prognostic markers leading to recommendation for antiestrogen therapy since her tumor is ER positive PR positive and HER-2/neu negative with a low proliferation marker Ki-67. We also discussed Oncotype DX testing depending on her final pathology .we discussed the possibility of her having chemotherapy if her Oncotype DX score is intermediate or high grade.  #3 we discussed adjuvant antiestrogen therapy consisting  of tamoxifen vs aromatase inhibitors.  We discussed side effects of tamoxifen consisting of but not limited to hot flashes, night sweats, mood swings, induration or cataracts, irregular periods, endometrial cancer, blood clots/thrombosis, vaginal bleeding.We discussed side effects of aromatase inhibitors including but not limited to myalgias arthralgias, osteopenia/osteoporosis, hot flashes mood swings hyperlipidemia.  #4 patient and I discussed smoking cessation. She does see Dr. Baird Lyons from pulmonary. I gave her smoking cessation materials.  #5 patient is very interested in having scans performed to make sure she doesn't have any other cancers  In the body before she decides to proceed with her breast surgery. I have set her out for CT of the chest abdomen and pelvis.  #6 patient is also interested in having genetic counseling and testing performed since she does have a grand daughter. She is very concerned about the possibility of her having inherited the gene such as BRCA1 and BRCA2. I will set her up to be seen by our genetic counselor.  #7 I will see the patient back after her surgery.     Discussion: Patient is being treated per NCCN breast cancer care guidelines appropriate for stage.II   Thank you so much for allowing me to participate in the care of Crystal Hill. I will continue to follow up the patient with you and assist in her care.  All questions were answered. The patient knows to  call the clinic with any problems, questions or concerns. We can certainly see the patient much sooner if necessary.  I spent 60 minutes counseling the patient face to face. The total time spent in the appointment was 60 minutes.  Marcy Panning, MD Medical/Oncology [facility] [phone] (beeper) [phone] (Office)  10/18/2013, 3:23 PM

## 2013-10-19 ENCOUNTER — Telehealth: Payer: Self-pay | Admitting: Oncology

## 2013-10-19 ENCOUNTER — Telehealth: Payer: Self-pay | Admitting: *Deleted

## 2013-10-19 ENCOUNTER — Encounter: Payer: Self-pay | Admitting: Radiation Oncology

## 2013-10-19 NOTE — Progress Notes (Signed)
Location of Breast Cancer: left breast   Histology per Pathology Report:  09/28/13   Receptor Status: ER(98% positive), PR (100% positive), Her2-neu ( )  Did patient present with symptoms (if so, please note symptoms) or was this found on screening mammography?: She recently noted a mass in the lateral aspect of her left breast approximately 2 weeks ago  Past/Anticipated interventions by surgeon, if any: tentatively scheduled for right partial mastectomy with axillary sentinel lymph node biopsy by Dr. Excell Seltzer on 10/28/13   Past/Anticipated interventions by medical oncology, if any:   Dr. Humphrey Rolls 10/18/13, pt will be seen after surgery/lumpectomy w/Dr Hoxworth.  Lymphedema issues, if any:  no  Pain issues, if any:  Soreness, tenderness of left breast biopsy site. Pt not taking any OTC for this pain.  SAFETY ISSUES:  Prior radiation? no  Pacemaker/ICD? no  Possible current pregnancy?no  Is the patient on methotrexate? no  Current Complaints / other details:  Scheduled for CT chest/abd/pelvis on 10/21/13 Menarche age 3, P55, first live birth age 57, menopause 63, HRT x 35 yrs Raised 7 sons, 2 biological

## 2013-10-19 NOTE — Telephone Encounter (Signed)
, °

## 2013-10-19 NOTE — Telephone Encounter (Signed)
Message copied by Hebert Soho on Wed Oct 19, 2013  3:55 PM ------      Message from: Crystal Hill      Created: Wed Oct 19, 2013  8:23 AM       Call patient: blood work from 2/3 looks terrific ------

## 2013-10-19 NOTE — Telephone Encounter (Signed)
As noted below by Dr. Humphrey Rolls, I informed patient that her labs look good. Patient verbalized understanding.

## 2013-10-20 ENCOUNTER — Encounter: Payer: Self-pay | Admitting: Radiation Oncology

## 2013-10-20 ENCOUNTER — Ambulatory Visit
Admission: RE | Admit: 2013-10-20 | Discharge: 2013-10-20 | Disposition: A | Discharge status: Home / Self Care | Payer: Medicare Other | Source: Ambulatory Visit | Attending: Radiation Oncology | Admitting: Radiation Oncology

## 2013-10-20 ENCOUNTER — Telehealth (INDEPENDENT_AMBULATORY_CARE_PROVIDER_SITE_OTHER): Payer: Self-pay | Admitting: General Surgery

## 2013-10-20 ENCOUNTER — Encounter: Payer: Self-pay | Admitting: *Deleted

## 2013-10-20 ENCOUNTER — Telehealth: Payer: Self-pay | Admitting: *Deleted

## 2013-10-20 VITALS — BP 139/78 | HR 65 | Temp 98.2°F | Resp 20 | Wt 133.0 lb

## 2013-10-20 DIAGNOSIS — Z79899 Other long term (current) drug therapy: Secondary | ICD-10-CM | POA: Diagnosis not present

## 2013-10-20 DIAGNOSIS — Z803 Family history of malignant neoplasm of breast: Secondary | ICD-10-CM | POA: Diagnosis not present

## 2013-10-20 DIAGNOSIS — Z170 Estrogen receptor positive status [ER+]: Secondary | ICD-10-CM | POA: Diagnosis not present

## 2013-10-20 DIAGNOSIS — C50412 Malignant neoplasm of upper-outer quadrant of left female breast: Secondary | ICD-10-CM

## 2013-10-20 DIAGNOSIS — F17200 Nicotine dependence, unspecified, uncomplicated: Secondary | ICD-10-CM | POA: Insufficient documentation

## 2013-10-20 DIAGNOSIS — C50419 Malignant neoplasm of upper-outer quadrant of unspecified female breast: Secondary | ICD-10-CM | POA: Insufficient documentation

## 2013-10-20 DIAGNOSIS — Z801 Family history of malignant neoplasm of trachea, bronchus and lung: Secondary | ICD-10-CM | POA: Insufficient documentation

## 2013-10-20 NOTE — Progress Notes (Signed)
Late entry 10/18/13- met with pt after her appt with Dr. Humphrey Rolls.  Gave her navigation materials and contact information.  Encourage pt to call with needs.

## 2013-10-20 NOTE — Telephone Encounter (Signed)
Received call from [facility], at the [facility], relating that the pt has decided she wants masty with reconstruction.  Dawn has set up appt with Dr. Iran Planas and will get pt in to see Dr. Excell Seltzer to discuss surgery on Wednesday, October 26, 2013.

## 2013-10-20 NOTE — Progress Notes (Signed)
Please see the Nurse Progress Note in the MD Initial Consult Encounter for this patient. 

## 2013-10-20 NOTE — Telephone Encounter (Signed)
Pt called stating she wants to change her surgery plan.  She has now opted for mastectomy with reconstruction.  I scheduled her with Dr. Iran Planas on 2/11 and in formed Dr. Excell Seltzer and his office of the change.  I scheduled an appt with Dr. Excell Seltzer on 2/11 to discuss the change.  I will notify her physician team.  Pt wanted to keep her same surgery date.  Informed pt that I was unclear of that considering they would need more time in the OR.  Pt a little upset about the change in surgery date if it takes place d/t arrangements she has already made.  Gave pt encouragement and support.  Pt denies further needs at this time.  Contact information given.

## 2013-10-20 NOTE — Progress Notes (Addendum)
Radiation Oncology         [phone]) [phone] ________________________________  Initial outpatient Consultation - Date: 10/20/2013   Name: Crystal Hill MRN: [id number]   DOB: 1940-12-28  REFERRING PHYSICIAN: Edward Jolly, MD  DIAGNOSIS:  1. Breast cancer of upper-outer quadrant of left female breast     STAGE: Malignant neoplasm of upper-outer quadrant of female breast Cancer of upper-outer quadrant of female breast   Primary site: Breast   Staging method: AJCC 7th Edition   Clinical: Stage IIA (T2, N0, cM0)   Summary: Stage IIA (T2, N0, cM0)   HISTORY OF PRESENT ILLNESS::Crystal Hill is a 73 y.o. female  outdated a left breast mass. She had not had a mammogram for 30 years do to her fears of mammography and symptomatic experiences she experienced in her 23s with workups for cyst. She was worked up at the [facility] for an ultrasound was performed. She again refused mammograms. This showed an irregular mass in the upper outer quadrant measuring 2.2 cm. She elected for biopsy that same day which showed an invasive ductal carcinoma with associated DCIS. This was ER/PR positive with a Ki-67 of 7%. HER-2 was equivocal. She had an MRI of the bilateral breasts on 10/12/2013. This showed an irregularly enhancing mass in the outer left breast measuring 2.1 x 1.1 x 1.4 cm. No findings suggestive multifocal or metastatic multicentric disease. The right breast was negative. No enlarged lymph nodes were noted. She is accompanied by her son today. She underwent in menopause in 1977 and has been on hormone replacement therapy for the past 35 years. She had recently discontinued that. She had her first live birth at the age of 68. She noticed some pain in her left breast since her biopsy. She was interested in breast conservation and was referred to me for consideration of radiation in the management of her disease. She does report a significant fear of radiation stemming from the treatment of her  father in 40 of lung cancer. She is convinced that radiation caused his death due to ulcers. She is also very interested in MammoSite radiation as she likes twice a day of radiation being delivered into the middle of the breast and on outside. She refuses to have followup mammograms. 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 She does continue to smoke.Marland Kitchen  PREVIOUS RADIATION THERAPY: No  PAST MEDICAL HISTORY:  has a past medical history of Pulmonary nodule; Chronic sinusitis; History of recurrent pneumonia; Tobacco abuse; Nephrolithiasis; and Breast cancer.    PAST SURGICAL HISTORY: Past Surgical History  Procedure Laterality Date  . Partial hysterectomy    . Tubal ligation      FAMILY HISTORY:  Family History  Problem Relation Age of Onset  . Cancer    . COPD Mother   . Cancer Father   . Cancer Maternal Aunt 75    breast  . Cancer Paternal Aunt 30    breast    SOCIAL HISTORY:  History  Substance Use Topics  . Smoking status: Current Every Day Smoker -- 2.00 packs/day for 55 years    Types: Cigarettes  . Smokeless tobacco: Never Used  . Alcohol Use: No    ALLERGIES: Codeine; Penicillins; and Sulfonamide derivatives  MEDICATIONS:  Current Outpatient Prescriptions  Medication Sig Dispense Refill  . Azelaic Acid (FINACEA) 15 % cream As directed       .  azelastine (ASTELIN) 137 MCG/SPRAY nasal spray Place 1 spray into the nose 2 (two) times daily. Use in each nostril as directed       . cholecalciferol (VITAMIN D) 1000 UNITS tablet 2 tablets daily      . Docosahexaenoic Acid (DHA COMPLETE PO) 800 mg daily       . Lactobacillus (ACIDOPHILUS) CAPS Once a day        . Lysine 1000 MG TABS Once a day       . mometasone (NASONEX) 50 MCG/ACT nasal spray Place 2 sprays into the nose daily.        . Multiple Vitamin (MULTIVITAMIN) tablet Take 1 tablet by mouth daily.        .  Omega-3 Fatty Acids (EPA PO) 1600 mg daily       . omeprazole (PRILOSEC) 10 MG capsule Take 10 mg by mouth daily.      Marland Kitchen venlafaxine XR (EFFEXOR-XR) 75 MG 24 hr capsule       . vitamin C (ASCORBIC ACID) 500 MG tablet Take 500 mg by mouth daily.        . zaleplon (SONATA) 10 MG capsule Take 10 mg by mouth at bedtime.         No current facility-administered medications for this encounter.    REVIEW OF SYSTEMS:  A 15 point review of systems is documented in the electronic medical record. This was obtained by the nursing staff. However, I reviewed this with the patient to discuss relevant findings and make appropriate changes.  Pertinent items are noted in HPI.  PHYSICAL EXAM:  Filed Vitals:   10/20/13 1037  BP: 139/78  Pulse: 65  Temp: 98.2 F (36.8 C)  Resp: 20  .133 lb (60.328 kg). Small breasts bailterally. Appears younger than her stated age. She has a palpable mass in the upper outer quadrant of the left breast. There is some bruising inferior to this. No palpable adenopathy. No palpable supraclavicular nodes. She is alert and oriented x3.  LABORATORY DATA:  Lab Results  Component Value Date   WBC 5.4 10/18/2013   HGB 14.7 10/18/2013   HCT 43.9 10/18/2013   MCV 97.6 10/18/2013   PLT 191 10/18/2013   Lab Results  Component Value Date   NA 141 10/18/2013   K 3.7 10/18/2013   CL 106 03/21/2011   CO2 27 10/18/2013   Lab Results  Component Value Date   ALT 17 10/18/2013   AST 18 10/18/2013   ALKPHOS 82 10/18/2013   BILITOT 0.43 10/18/2013     RADIOGRAPHY: Mr Breast Bilateral W Wo Contrast  10/12/2013   CLINICAL DATA:  73 year old female with recently diagnosed invasive ductal carcinoma of the left breast at 3 o'clock, with a 2.2 cm solid mass seen sonographically.  EXAM: BILATERAL BREAST MRI WITH AND WITHOUT CONTRAST  LABS:  BUN and creatinine were obtained on site at [facility] at  [address]  Results:  BUN 13 mg/dL,  Creatinine 0.7 mg/dL.  TECHNIQUE: Multiplanar, multisequence  MR images of both breasts were obtained prior to and following the intravenous administration of 30m of MultiHance.  THREE-DIMENSIONAL MR IMAGE RENDERING ON INDEPENDENT WORKSTATION:  Three-dimensional MR images were rendered by post-processing of the original MR data on an independent workstation. The three-dimensional MR images were interpreted, and findings are reported in the following complete MRI report for this study. Three dimensional images were evaluated at the independent DynaCad workstation  COMPARISON:  Previous left breast ultrasound from 09/28/2013.  No mammograms available (the patient had refused mammography).  FINDINGS: Breast composition: c:  Heterogeneous fibroglandular tissue  Background parenchymal enhancement: Mild, with multiple small scattered enhancing foci which are favored to be benign given the multiplicity and bilaterality.  Right breast: No suspicious rapidly enhancing masses or abnormal areas of enhancement in the right breast to suggest malignancy.  Left breast: Irregular enhancing mass in the outer left breast compatible with known biopsy proven malignancy measures 2.1 cm AP, 1.1 cm transverse, and 1.4 cm craniocaudal. No additional suspicious enhancing masses or suspicious areas of enhancement seen to suggest multifocal or multicentric disease. No findings to suggest skin, nipple, or pectoralis involvement.  Lymph nodes: No morphologically abnormal axillary lymph nodes. No internal mammary lymphadenopathy.  Ancillary findings:  None.  IMPRESSION: 1. Biopsy proven malignancy in the outer left breast measuring up to 2.1 cm. No findings to suggest multifocal or multicentric disease.  2. No MRI evidence of malignancy in the right breast.  RECOMMENDATION: Treatment plan for known left breast malignancy.  BI-RADS CATEGORY  6: Known biopsy-proven malignancy - appropriate action should be taken.   Electronically Signed   By: Everlean Alstrom M.D.   On: 10/12/2013 12:10      IMPRESSION:  T2 N0 left breast cancer  PLAN: I spoke to the patient and her son today. We discussed the results of randomized trials showing equivalency in terms of survival between mastectomy and lumpectomy plus radiation. We discussed the results of trials looking at women over 66 who took antiestrogen therapy versus antiestrogen therapy plus radiation after lumpectomy. We discussed that there was no difference in terms of survival and only a slight improvement in terms of local control  in patients who took radiation. She is completely against taking any sort of external radiation. Given her small breast size I do not feel she is a good candidate for MammoSite. We discussed that her balloon surface to chest wall distance and or her balloon surface to skin distance would likely be compromised and she would not be a good candidate. We also discussed that she would likely have a palpable seroma cavity which she would find distressing. We discussed the randomized data supporting the use of partial breast radiation has not been published. We also discussed the 10 fold increase in risk in lung cancer in patients who undergo radiation and continued to smoke. She is not interested in smoking cessation at this time. I encouraged her to think about lumpectomy with antiestrogen therapy knowing that she would have to have followup mammograms or just doing a mastectomy and likely not having to take antiestrogen therapy or undergo followup mammograms or have radiation. All of this of course is dependent on her final pathology. I told her that should she have positive nodes could always change things and require her to have chemotherapy and/or radiation. She is going to meet back with Dr. Humphrey Rolls and Dr. Excell Seltzer to discuss her surgical and systemic options. I assume that her to test will be repeated on her final pathology. I have not scheduled followup with me. I spent 60 minutes  face to face with the patient and more than 50% of that  time was spent in counseling and/or coordination of care.   ------------------------------------------------  Thea Silversmith, MD

## 2013-10-21 ENCOUNTER — Ambulatory Visit (HOSPITAL_COMMUNITY)
Admission: RE | Admit: 2013-10-21 | Discharge: 2013-10-21 | Disposition: A | Discharge status: Home / Self Care | Payer: Medicare Other | Source: Ambulatory Visit | Attending: Oncology | Admitting: Oncology

## 2013-10-21 ENCOUNTER — Encounter (HOSPITAL_COMMUNITY): Payer: Self-pay

## 2013-10-21 DIAGNOSIS — K8020 Calculus of gallbladder without cholecystitis without obstruction: Secondary | ICD-10-CM | POA: Diagnosis not present

## 2013-10-21 DIAGNOSIS — C50919 Malignant neoplasm of unspecified site of unspecified female breast: Secondary | ICD-10-CM | POA: Insufficient documentation

## 2013-10-21 DIAGNOSIS — I2510 Atherosclerotic heart disease of native coronary artery without angina pectoris: Secondary | ICD-10-CM | POA: Diagnosis not present

## 2013-10-21 DIAGNOSIS — R160 Hepatomegaly, not elsewhere classified: Secondary | ICD-10-CM | POA: Diagnosis not present

## 2013-10-21 DIAGNOSIS — J984 Other disorders of lung: Secondary | ICD-10-CM | POA: Diagnosis not present

## 2013-10-21 DIAGNOSIS — K7689 Other specified diseases of liver: Secondary | ICD-10-CM | POA: Diagnosis not present

## 2013-10-21 DIAGNOSIS — N289 Disorder of kidney and ureter, unspecified: Secondary | ICD-10-CM | POA: Insufficient documentation

## 2013-10-21 DIAGNOSIS — J438 Other emphysema: Secondary | ICD-10-CM | POA: Insufficient documentation

## 2013-10-21 DIAGNOSIS — K7460 Unspecified cirrhosis of liver: Secondary | ICD-10-CM | POA: Insufficient documentation

## 2013-10-21 DIAGNOSIS — C50419 Malignant neoplasm of upper-outer quadrant of unspecified female breast: Secondary | ICD-10-CM

## 2013-10-21 MED ORDER — IOHEXOL 300 MG/ML  SOLN
100.0000 mL | Freq: Once | INTRAMUSCULAR | Status: AC | PRN
  Administered 2013-10-21: 100 mL via INTRAVENOUS

## 2013-10-24 ENCOUNTER — Telehealth: Payer: Self-pay | Admitting: Internal Medicine

## 2013-10-24 ENCOUNTER — Telehealth (INDEPENDENT_AMBULATORY_CARE_PROVIDER_SITE_OTHER): Payer: Self-pay | Admitting: General Surgery

## 2013-10-24 NOTE — Telephone Encounter (Signed)
Pt called surgery scheduling to change and postpone her surgery scheduled for Friday 2/13  She says that some doctor told her she is having a mastectomy now.  Please get in touch with patient  Let surgery scheduling know if any changes to Friday surgery

## 2013-10-25 ENCOUNTER — Telehealth (INDEPENDENT_AMBULATORY_CARE_PROVIDER_SITE_OTHER): Payer: Self-pay

## 2013-10-25 ENCOUNTER — Telehealth: Payer: Self-pay | Admitting: Internal Medicine

## 2013-10-25 ENCOUNTER — Other Ambulatory Visit: Payer: Self-pay | Admitting: *Deleted

## 2013-10-25 ENCOUNTER — Telehealth: Payer: Self-pay | Admitting: *Deleted

## 2013-10-25 DIAGNOSIS — C50419 Malignant neoplasm of upper-outer quadrant of unspecified female breast: Secondary | ICD-10-CM

## 2013-10-25 NOTE — Telephone Encounter (Signed)
I spoke with patient about results and he verbalized understanding and had no questions 

## 2013-10-25 NOTE — Telephone Encounter (Signed)
Pt is wanting results of recent CT scan.  CY - please advise on results. Thanks.

## 2013-10-25 NOTE — Telephone Encounter (Signed)
lmomtcb x1 for pt 

## 2013-10-25 NOTE — Telephone Encounter (Signed)
Pt returned call.  Spoke with patient and discussed CDY's results and recommendations regarding her CT's as stated below.  Pt verbalized her understanding and denied any questions/concerns at this time.  She is due for a physical with Dr Bevelyn Buckles (pt's PCP) in April 2015 - her CT's have been faxed to Dr Sharlett Iles via [facility].  Pt aware to contact the office with any other questions/concerns. Nothing further needed; will sign off.

## 2013-10-25 NOTE — Telephone Encounter (Signed)
The Ct scan of the chest doesn't identify lung lesions needing attention now. It does show emphysema, and I recommend a PFT to compare with one doen 5 years ago, so we can assess for progression. There is coronary artery calcification. Suggest she see her primary physician about this. If she doesn't have one, then suggest referral to cardiology for risk stratification.  The CT of the abdomen suggests some cirrhosis of the liver. If she doesn't have a primary internist, then we can refer her to GI for assessment.

## 2013-10-25 NOTE — Progress Notes (Signed)
Pt states surgery cancelled-to have mastectomy -not sure when Katie called and LM

## 2013-10-25 NOTE — Telephone Encounter (Signed)
Pt called requesting CT results and upset with plastic surgery consult.  Pt relate Dr. Iran Planas wanted her to quit smoking for 6-8wks prior to surgery to prevent healing complications and she was unable to do that, "it isn't on my list".  Tried to explain the reasoning and importance pt relayed she is a fast healer regardless of smoking.  Pt request a new appt with Dr. Harlow Mares but only if he would do surgery without her quitting smoking.  I called and spoke to [facility].  Melissa relayed the same as that Dr. Harlow Mares would require pt to quit smoking 6-8wks prior to prevent tissue death and poor healing.  Informed pt of this, but told her to call Dr. Harlow Mares office and speak to Butch Penny to discuss this further with reasoning.  Gave pt Dr. Harlow Mares office contact information.  Discussed pt CT scan with Dr. Humphrey Rolls.  Per Dr. Humphrey Rolls we will order an MRI of abd d/t lesion noted on liver.  Explained this to pt.    Pt also in question of equivocal result on her2.  Informed pt that we will retest on surgical excision.  Received verbal understanding.  Pt denies further needs or concerns at this time.

## 2013-10-25 NOTE — Telephone Encounter (Signed)
CT ordered by Dr Humphrey Rolls- she can go ovefr in more detail. There is no change in old lung densities, which appear benign with nothing new. There is mild emphysema and coronary artery disease.

## 2013-10-25 NOTE — Telephone Encounter (Signed)
Deneise Lever, MD at 10/25/2013 11:11 AM     Status: Signed        CT ordered by Dr Humphrey Rolls- she can go ovefr in more detail. There is no change in old lung densities, which appear benign with nothing new. There is mild emphysema and coronary artery disease.   --  Pt reports Dr. Janan Halter will not discuss this in detail with her. She is her "medical oncologists" and she is the one requested to send this to Dr. Annamaria Boots. She wants to know what we need to do now based off the results and would like results in more detail than what was giving. Pt did not want to schedule an OV since next available is not until March. Please advise thanks

## 2013-10-26 ENCOUNTER — Ambulatory Visit (INDEPENDENT_AMBULATORY_CARE_PROVIDER_SITE_OTHER): Payer: Medicare Other | Admitting: General Surgery

## 2013-10-26 ENCOUNTER — Encounter (INDEPENDENT_AMBULATORY_CARE_PROVIDER_SITE_OTHER): Payer: Self-pay | Admitting: General Surgery

## 2013-10-26 VITALS — BP 124/76 | HR 76 | Temp 97.6°F | Resp 18 | Ht 66.5 in | Wt 131.6 lb

## 2013-10-26 DIAGNOSIS — M545 Low back pain, unspecified: Secondary | ICD-10-CM | POA: Diagnosis not present

## 2013-10-26 DIAGNOSIS — IMO0001 Reserved for inherently not codable concepts without codable children: Secondary | ICD-10-CM | POA: Diagnosis not present

## 2013-10-26 DIAGNOSIS — M9981 Other biomechanical lesions of cervical region: Secondary | ICD-10-CM | POA: Diagnosis not present

## 2013-10-26 DIAGNOSIS — M999 Biomechanical lesion, unspecified: Secondary | ICD-10-CM | POA: Diagnosis not present

## 2013-10-26 DIAGNOSIS — M542 Cervicalgia: Secondary | ICD-10-CM | POA: Diagnosis not present

## 2013-10-26 DIAGNOSIS — C50419 Malignant neoplasm of upper-outer quadrant of unspecified female breast: Secondary | ICD-10-CM

## 2013-10-26 DIAGNOSIS — M546 Pain in thoracic spine: Secondary | ICD-10-CM | POA: Diagnosis not present

## 2013-10-26 NOTE — Patient Instructions (Signed)
We will discuss further plans after your evaluation with Dr. Harlow Mares next week.

## 2013-10-26 NOTE — Progress Notes (Signed)
Lm with Joellen Jersey if pt still having surgery-pt said she was not-

## 2013-10-26 NOTE — Progress Notes (Signed)
History: The patient returns for further treatment planning for her recently diagnosed clinical stage II a ER/PR positive cancer of the left breast. Since I saw her last she has seen Dr. Pablo Ledger from radiation oncology and the patient has decided that she definitely does not want any radiation therapy under any situation. After that discussion she felt that a mastectomy with reconstruction would be her best choice. She had a plastic surgery consultation but due to ongoing cigarette smoking she was not felt to be a candidate for immediate reconstruction. She comes back to the office today for further discussion. The patient states that she is becoming very frustrated and very upset with her current condition and just feels like giving up. She has a lot of stresses in her life related to the family and financial matters and her ill mother. She currently has scheduled a second plastic surgery consultation for next week. She cannot understand why she cannot have reconstruction while smoking when she has done well for multiple surgical procedures in the past. I had a long discussion with the patient today again regarding treatment options. At this point she feels very strongly and seems firmly decided that she will not accept radiation or hormonal therapy or chemotherapy. I told her that under the circumstances her best surgical treatment option would be total mastectomy with or without reconstruction. She would much prefer to have immediate reconstruction. I discussed other less traditional options with her including lumpectomy alone or hormones alone( but she definitely refuses any hormone treatment). I discussed that any of these would be better than no treatment at all. She wanted to know what would happen if she did nothing and I told her that she would progress and likely become a severe local problem in the breast which would be very unpleasant and she definitely should entertain some sort of therapy. We spent  about 45 minutes in the office today discussing this. Again I have recommended total mastectomy as her best choice with the above conditions. We decided to get back together after she speaks with the plastic surgeon next week.

## 2013-10-27 NOTE — Progress Notes (Signed)
[facility] Psychosocial Distress Screening Clinical Social Work  Clinical Social Work was referred by distress screening protocol.  The patient scored a 5 on the Psychosocial Distress Thermometer which indicates moderate distress. Clinical Social Worker Intern telephoned to assess for distress and other psychosocial needs. Patient expressed many concerns regarding being the caregiver for her elderly mother.  Patient also stated that she feels as if she is "treated like red-neck trailer trash" because of her choice to smoke.  Patient is having a hard time quitting smoking.  Patient is frustrated at the process of having a mastectomy and reconstructive surgery and feels it is not going quickly.  Patient does not understand treatment plan.  Patient made references to being stressed and has spoken with Dr. concerning medication to assist with stress.  CSWI provided supportive listening.  CSWI reviewed with Patient counseling services available and she is aware to reach out if needed.   Clinical Social Worker follow up needed: no  If yes, follow up plan:   Satya Bohall S. Cobb Work Intern [employer] [phone]

## 2013-10-28 ENCOUNTER — Encounter (HOSPITAL_BASED_OUTPATIENT_CLINIC_OR_DEPARTMENT_OTHER): Admission: RE | Payer: Self-pay | Source: Ambulatory Visit

## 2013-10-28 ENCOUNTER — Encounter (HOSPITAL_COMMUNITY): Payer: Medicare Other

## 2013-10-28 ENCOUNTER — Ambulatory Visit (HOSPITAL_BASED_OUTPATIENT_CLINIC_OR_DEPARTMENT_OTHER): Admission: RE | Admit: 2013-10-28 | Payer: Medicare Other | Source: Ambulatory Visit | Admitting: General Surgery

## 2013-10-28 ENCOUNTER — Ambulatory Visit
Admission: RE | Admit: 2013-10-28 | Discharge: 2013-10-28 | Disposition: A | Discharge status: Home / Self Care | Payer: Medicare Other | Source: Ambulatory Visit | Attending: Oncology | Admitting: Oncology

## 2013-10-28 DIAGNOSIS — C50419 Malignant neoplasm of upper-outer quadrant of unspecified female breast: Secondary | ICD-10-CM

## 2013-10-28 DIAGNOSIS — D1803 Hemangioma of intra-abdominal structures: Secondary | ICD-10-CM | POA: Diagnosis not present

## 2013-10-28 SURGERY — BREAST LUMPECTOMY WITH NEEDLE LOCALIZATION AND AXILLARY SENTINEL LYMPH NODE BX
Anesthesia: General | Site: Breast | Laterality: Left

## 2013-10-28 MED ORDER — GADOBENATE DIMEGLUMINE 529 MG/ML IV SOLN
12.0000 mL | Freq: Once | INTRAVENOUS | Status: AC | PRN
  Administered 2013-10-28: 12 mL via INTRAVENOUS

## 2013-10-28 NOTE — Telephone Encounter (Addendum)
Called patient to see how she's doing since her office visit on Wednesday (10/26/13)  Patient reports that she's scheduled to have an MRI today at 4:45pm due to abnormal findings on CT.  Patient has appointment with Dr. Harlow Mares on 11/01/13.  Encouraged patient to call our office if she has any questions or concerns.

## 2013-10-28 NOTE — Telephone Encounter (Signed)
Surgery cancelled from 10/28/13.

## 2013-11-01 ENCOUNTER — Telehealth: Payer: Self-pay | Admitting: *Deleted

## 2013-11-01 NOTE — Telephone Encounter (Signed)
As noted below by Dr. Humphrey Rolls, I informed patient that MRI of liver shows no cancer, only a benign blood vessel. Patient verbalized understanding.

## 2013-11-01 NOTE — Telephone Encounter (Signed)
Message copied by Hebert Soho on Tue Nov 01, 2013 10:56 AM ------      Message from: Deatra Robinson      Created: Sun Oct 30, 2013 10:10 PM       Liver MRI no cancer, only a hemangioma (benign blood vessels) ------

## 2013-11-03 DIAGNOSIS — C50919 Malignant neoplasm of unspecified site of unspecified female breast: Secondary | ICD-10-CM | POA: Diagnosis not present

## 2013-11-03 NOTE — Progress Notes (Signed)
Clinical Network engineer followed up with Patient to check after MRI.  Patient stated that she had received results and everything was "benign" but she did not feel confident in the transfer of information.  CSWI encouraged Patient to contact Dr with further questions.  Patient stated that she would not.  Patient explained she has appointment scheduled today with plastic surgeon.  Patient is hoping to find out further information on her treatment. Patient stated, "it has been 6 weeks and still nothing has been done".  Patient was encouraged to call CSWI if further assistance was needed.   Trena Dunavan S. Munday Work Intern [employer] [phone]

## 2013-11-07 ENCOUNTER — Telehealth (INDEPENDENT_AMBULATORY_CARE_PROVIDER_SITE_OTHER): Payer: Self-pay | Admitting: General Surgery

## 2013-11-07 ENCOUNTER — Other Ambulatory Visit (INDEPENDENT_AMBULATORY_CARE_PROVIDER_SITE_OTHER): Payer: Self-pay | Admitting: General Surgery

## 2013-11-07 DIAGNOSIS — C50919 Malignant neoplasm of unspecified site of unspecified female breast: Secondary | ICD-10-CM

## 2013-11-07 NOTE — Telephone Encounter (Signed)
Called patient had further discussed treatment planning for her recently diagnosed cancer of the left breast.  After a second plastic surgery opinion she has elected to proceed with left total mastectomy without reconstruction. I discussed with her that I would plan a sentinel lymph node biopsy with touch prep for frozen section and plan to proceed with axillary dissection should her node be positive since she will not have radiation. We discussed the small incidence of a positive sentinel lymph node on final pathology when it was negative on touch prep that could require further surgery. All her questions regarding the procedure were answered and she feels comfortable and firm with this decision.

## 2013-11-08 ENCOUNTER — Telehealth: Payer: Self-pay | Admitting: *Deleted

## 2013-11-08 NOTE — Telephone Encounter (Signed)
Pt called in question of appt with Dr. Humphrey Rolls.  Informed pt that I will r/s appt for after surgery.  Pt request that we wait until she "gets over this hump" and depending on Dr. Lear Ng recommendation.  Discussed importance of f/u with medical oncologist with pt.  Will inform pt team.

## 2013-11-11 ENCOUNTER — Encounter (HOSPITAL_COMMUNITY): Payer: Self-pay | Admitting: Pharmacy Technician

## 2013-11-15 ENCOUNTER — Ambulatory Visit: Payer: Medicare Other | Admitting: Oncology

## 2013-11-15 ENCOUNTER — Other Ambulatory Visit: Payer: Medicare Other

## 2013-11-17 ENCOUNTER — Encounter (HOSPITAL_COMMUNITY)
Admission: RE | Admit: 2013-11-17 | Discharge: 2013-11-17 | Disposition: A | Discharge status: Home / Self Care | Payer: Medicare Other | Source: Ambulatory Visit | Attending: General Surgery | Admitting: General Surgery

## 2013-11-17 ENCOUNTER — Encounter (HOSPITAL_COMMUNITY): Payer: Self-pay

## 2013-11-17 DIAGNOSIS — Z01812 Encounter for preprocedural laboratory examination: Secondary | ICD-10-CM | POA: Diagnosis not present

## 2013-11-17 HISTORY — DX: Pneumonia, unspecified organism: J18.9

## 2013-11-17 HISTORY — DX: Anxiety disorder, unspecified: F41.9

## 2013-11-17 HISTORY — DX: Other specified postprocedural states: R11.2

## 2013-11-17 HISTORY — DX: Other specified postprocedural states: Z98.890

## 2013-11-17 LAB — BASIC METABOLIC PANEL
BUN: 14 mg/dL (ref 6–23)
CHLORIDE: 102 meq/L (ref 96–112)
CO2: 27 mEq/L (ref 19–32)
Calcium: 10.1 mg/dL (ref 8.4–10.5)
Creatinine, Ser: 0.74 mg/dL (ref 0.50–1.10)
GFR calc Af Amer: >90 mL/min (ref >90)
GFR calc non Af Amer: 83 mL/min — ABNORMAL LOW (ref >90)
Glucose, Bld: 83 mg/dL (ref 70–99)
POTASSIUM: 4.4 meq/L (ref 3.7–5.3)
SODIUM: 141 meq/L (ref 137–147)

## 2013-11-17 LAB — CBC
HCT: 42.2 % (ref 36.0–46.0)
Hemoglobin: 15.3 g/dL — ABNORMAL HIGH (ref 12.0–15.0)
MCH: 34 pg (ref 26.0–34.0)
MCHC: 36.3 g/dL — ABNORMAL HIGH (ref 30.0–36.0)
MCV: 93.8 fL (ref 78.0–100.0)
PLATELETS: 150 10*3/uL (ref 150–400)
RBC: 4.5 MIL/uL (ref 3.87–5.11)
RDW: 13.8 % (ref 11.5–15.5)
WBC: 5.9 10*3/uL (ref 4.0–10.5)

## 2013-11-17 NOTE — Pre-Procedure Instructions (Signed)
YATZIRI WAINWRIGHT  11/17/2013   Your procedure is scheduled on:  11/22/13  Report to [facility]  2 * 3 at 11 AM.  Call this number if you have problems the morning of surgery: [phone]   Remember:   Do not eat food or drink liquids after midnight.   Take these medicines the morning of surgery with A SIP OF WATER: effexor   Do not wear jewelry, make-up or nail polish.  Do not wear lotions, powders, or perfumes. You may wear deodorant.  Do not shave 48 hours prior to surgery. Men may shave face and neck.  Do not bring valuables to the hospital.  [facility] is not responsible                  for any belongings or valuables.               Contacts, dentures or bridgework may not be worn into surgery.  Leave suitcase in the car. After surgery it may be brought to your room.  For patients admitted to the hospital, discharge time is determined by your                treatment team.               Patients discharged the day of surgery will not be allowed to drive  home.  Name and phone number of your driver: family  Special Instructions: Shower using CHG 2 nights before surgery and the night before surgery.  If you shower the day of surgery use CHG.  Use special wash - you have one bottle of CHG for all showers.  You should use approximately 1/3 of the bottle for each shower.   Please read over the following fact sheets that you were given: Pain Booklet, Coughing and Deep Breathing and Surgical Site Infection Prevention

## 2013-11-21 MED ORDER — CIPROFLOXACIN IN D5W 400 MG/200ML IV SOLN
400.0000 mg | INTRAVENOUS | Status: AC
  Administered 2013-11-22: 400 mg via INTRAVENOUS
  Filled 2013-11-21: qty 200

## 2013-11-21 MED ORDER — HEPARIN SODIUM (PORCINE) 5000 UNIT/ML IJ SOLN
5000.0000 [IU] | Freq: Once | INTRAMUSCULAR | Status: AC
  Administered 2013-11-22: 5000 [IU] via SUBCUTANEOUS
  Filled 2013-11-21: qty 1

## 2013-11-22 ENCOUNTER — Encounter (HOSPITAL_COMMUNITY): Payer: Medicare Other | Admitting: Anesthesiology

## 2013-11-22 ENCOUNTER — Encounter (HOSPITAL_COMMUNITY)
Admission: RE | Admit: 2013-11-22 | Discharge: 2013-11-22 | Disposition: A | Discharge status: Home / Self Care | Payer: Medicare Other | Source: Ambulatory Visit | Attending: General Surgery | Admitting: General Surgery

## 2013-11-22 ENCOUNTER — Ambulatory Visit (HOSPITAL_COMMUNITY): Payer: Medicare Other | Admitting: Anesthesiology

## 2013-11-22 ENCOUNTER — Encounter (HOSPITAL_COMMUNITY): Payer: Self-pay | Admitting: Certified Registered"

## 2013-11-22 ENCOUNTER — Encounter (HOSPITAL_COMMUNITY)
Admission: RE | Disposition: A | Discharge status: Home / Self Care | Payer: Self-pay | Source: Ambulatory Visit | Attending: General Surgery

## 2013-11-22 ENCOUNTER — Observation Stay (HOSPITAL_COMMUNITY)
Admission: RE | Admit: 2013-11-22 | Discharge: 2013-11-23 | Disposition: A | Discharge status: Home / Self Care | Payer: Medicare Other | Source: Ambulatory Visit | Attending: General Surgery | Admitting: General Surgery

## 2013-11-22 DIAGNOSIS — F411 Generalized anxiety disorder: Secondary | ICD-10-CM | POA: Diagnosis not present

## 2013-11-22 DIAGNOSIS — C50919 Malignant neoplasm of unspecified site of unspecified female breast: Secondary | ICD-10-CM

## 2013-11-22 DIAGNOSIS — J329 Chronic sinusitis, unspecified: Secondary | ICD-10-CM | POA: Diagnosis not present

## 2013-11-22 DIAGNOSIS — F17200 Nicotine dependence, unspecified, uncomplicated: Secondary | ICD-10-CM | POA: Insufficient documentation

## 2013-11-22 DIAGNOSIS — Z87442 Personal history of urinary calculi: Secondary | ICD-10-CM | POA: Diagnosis not present

## 2013-11-22 DIAGNOSIS — G8918 Other acute postprocedural pain: Secondary | ICD-10-CM | POA: Diagnosis not present

## 2013-11-22 DIAGNOSIS — R911 Solitary pulmonary nodule: Secondary | ICD-10-CM | POA: Diagnosis not present

## 2013-11-22 DIAGNOSIS — Z780 Asymptomatic menopausal state: Secondary | ICD-10-CM | POA: Insufficient documentation

## 2013-11-22 DIAGNOSIS — Z8701 Personal history of pneumonia (recurrent): Secondary | ICD-10-CM | POA: Insufficient documentation

## 2013-11-22 DIAGNOSIS — D0590 Unspecified type of carcinoma in situ of unspecified breast: Secondary | ICD-10-CM | POA: Insufficient documentation

## 2013-11-22 HISTORY — PX: SIMPLE MASTECTOMY WITH AXILLARY SENTINEL NODE BIOPSY: SHX6098

## 2013-11-22 SURGERY — SIMPLE MASTECTOMY WITH AXILLARY SENTINEL NODE BIOPSY
Anesthesia: Regional | Site: Breast | Laterality: Left

## 2013-11-22 MED ORDER — LACTATED RINGERS IV SOLN
INTRAVENOUS | Status: DC | PRN
  Administered 2013-11-22 (×2): via INTRAVENOUS

## 2013-11-22 MED ORDER — DEXAMETHASONE SODIUM PHOSPHATE 10 MG/ML IJ SOLN
INTRAMUSCULAR | Status: DC | PRN
  Administered 2013-11-22: 10 mg via INTRAVENOUS

## 2013-11-22 MED ORDER — EPHEDRINE SULFATE 50 MG/ML IJ SOLN
INTRAMUSCULAR | Status: DC | PRN
  Administered 2013-11-22: 10 mg via INTRAVENOUS
  Administered 2013-11-22: 5 mg via INTRAVENOUS
  Administered 2013-11-22: 10 mg via INTRAVENOUS
  Administered 2013-11-22: 15 mg via INTRAVENOUS

## 2013-11-22 MED ORDER — FENTANYL CITRATE 0.05 MG/ML IJ SOLN
INTRAMUSCULAR | Status: DC | PRN
  Administered 2013-11-22: 50 ug via INTRAVENOUS

## 2013-11-22 MED ORDER — FENTANYL CITRATE 0.05 MG/ML IJ SOLN
50.0000 ug | Freq: Once | INTRAMUSCULAR | Status: AC
  Administered 2013-11-22: 50 ug via INTRAVENOUS

## 2013-11-22 MED ORDER — CHLORHEXIDINE GLUCONATE 4 % EX LIQD
1.0000 "application " | Freq: Once | CUTANEOUS | Status: DC
  Filled 2013-11-22: qty 15

## 2013-11-22 MED ORDER — FLUTICASONE PROPIONATE 50 MCG/ACT NA SUSP
1.0000 | Freq: Every day | NASAL | Status: DC
  Filled 2013-11-22: qty 16

## 2013-11-22 MED ORDER — PHENYLEPHRINE HCL 10 MG/ML IJ SOLN
INTRAMUSCULAR | Status: DC | PRN
  Administered 2013-11-22 (×2): 80 ug via INTRAVENOUS

## 2013-11-22 MED ORDER — EPHEDRINE SULFATE 50 MG/ML IJ SOLN
INTRAMUSCULAR | Status: AC
  Filled 2013-11-22: qty 1

## 2013-11-22 MED ORDER — CIPROFLOXACIN IN D5W 400 MG/200ML IV SOLN: 400.0000 mg | INTRAVENOUS | Status: DC

## 2013-11-22 MED ORDER — DEXTROSE IN LACTATED RINGERS 5 % IV SOLN
INTRAVENOUS | Status: DC
  Administered 2013-11-22: 20:00:00 via INTRAVENOUS

## 2013-11-22 MED ORDER — SODIUM CHLORIDE 0.9 % IJ SOLN
INTRAMUSCULAR | Status: AC
  Filled 2013-11-22: qty 10

## 2013-11-22 MED ORDER — OXYCODONE-ACETAMINOPHEN 5-325 MG PO TABS
1.0000 | ORAL_TABLET | ORAL | Status: DC | PRN
  Administered 2013-11-22: 2 via ORAL

## 2013-11-22 MED ORDER — SODIUM CHLORIDE 0.9 % IJ SOLN
INTRAMUSCULAR | Status: DC | PRN
  Administered 2013-11-22: 13:00:00

## 2013-11-22 MED ORDER — ONDANSETRON HCL 4 MG/2ML IJ SOLN
INTRAMUSCULAR | Status: AC
  Filled 2013-11-22: qty 2

## 2013-11-22 MED ORDER — PROPOFOL 10 MG/ML IV BOLUS
INTRAVENOUS | Status: DC | PRN
  Administered 2013-11-22: 100 mg via INTRAVENOUS
  Administered 2013-11-22 (×2): 50 mg via INTRAVENOUS

## 2013-11-22 MED ORDER — ONDANSETRON HCL 4 MG PO TABS: 4.0000 mg | ORAL_TABLET | Freq: Four times a day (QID) | ORAL | Status: DC | PRN

## 2013-11-22 MED ORDER — MIDAZOLAM HCL 2 MG/2ML IJ SOLN
INTRAMUSCULAR | Status: AC
  Administered 2013-11-22: 2 mg
  Filled 2013-11-22: qty 2

## 2013-11-22 MED ORDER — METHYLENE BLUE 1 % INJ SOLN
INTRAMUSCULAR | Status: AC
  Filled 2013-11-22: qty 10

## 2013-11-22 MED ORDER — PROPOFOL 10 MG/ML IV BOLUS
INTRAVENOUS | Status: AC
  Filled 2013-11-22: qty 20

## 2013-11-22 MED ORDER — HYDROMORPHONE HCL PF 1 MG/ML IJ SOLN
INTRAMUSCULAR | Status: AC
  Administered 2013-11-22: 0.25 mg via INTRAVENOUS
  Filled 2013-11-22: qty 1

## 2013-11-22 MED ORDER — OXYCODONE-ACETAMINOPHEN 5-325 MG PO TABS
ORAL_TABLET | ORAL | Status: AC
  Administered 2013-11-22: 2 via ORAL
  Filled 2013-11-22: qty 2

## 2013-11-22 MED ORDER — ROCURONIUM BROMIDE 50 MG/5ML IV SOLN
INTRAVENOUS | Status: AC
  Filled 2013-11-22: qty 1

## 2013-11-22 MED ORDER — LIDOCAINE HCL (CARDIAC) 20 MG/ML IV SOLN
INTRAVENOUS | Status: DC | PRN
  Administered 2013-11-22: 60 mg via INTRAVENOUS

## 2013-11-22 MED ORDER — BUPIVACAINE-EPINEPHRINE PF 0.5-1:200000 % IJ SOLN
INTRAMUSCULAR | Status: DC | PRN
  Administered 2013-11-22: 35 mL

## 2013-11-22 MED ORDER — PREGABALIN 75 MG PO CAPS
75.0000 mg | ORAL_CAPSULE | Freq: Every day | ORAL | Status: DC
  Filled 2013-11-22: qty 1

## 2013-11-22 MED ORDER — VENLAFAXINE HCL ER 75 MG PO CP24
75.0000 mg | ORAL_CAPSULE | Freq: Two times a day (BID) | ORAL | Status: DC
  Administered 2013-11-22: 75 mg via ORAL
  Filled 2013-11-22 (×3): qty 1

## 2013-11-22 MED ORDER — MORPHINE SULFATE 2 MG/ML IJ SOLN
2.0000 mg | INTRAMUSCULAR | Status: DC | PRN
Start: 2013-11-22 — End: 2013-11-23

## 2013-11-22 MED ORDER — MIDAZOLAM HCL 5 MG/ML IJ SOLN: 2.0000 mg | Freq: Once | INTRAMUSCULAR | Status: DC

## 2013-11-22 MED ORDER — DEXAMETHASONE SODIUM PHOSPHATE 10 MG/ML IJ SOLN
INTRAMUSCULAR | Status: AC
  Filled 2013-11-22: qty 1

## 2013-11-22 MED ORDER — ALBUMIN HUMAN 5 % IV SOLN
INTRAVENOUS | Status: DC | PRN
  Administered 2013-11-22: 14:00:00 via INTRAVENOUS

## 2013-11-22 MED ORDER — HEPARIN SODIUM (PORCINE) 5000 UNIT/ML IJ SOLN
5000.0000 [IU] | Freq: Three times a day (TID) | INTRAMUSCULAR | Status: DC
  Administered 2013-11-22 – 2013-11-23 (×2): 5000 [IU] via SUBCUTANEOUS
  Filled 2013-11-22 (×5): qty 1

## 2013-11-22 MED ORDER — FENTANYL CITRATE 0.05 MG/ML IJ SOLN
INTRAMUSCULAR | Status: AC
  Filled 2013-11-22: qty 5

## 2013-11-22 MED ORDER — 0.9 % SODIUM CHLORIDE (POUR BTL) OPTIME
TOPICAL | Status: DC | PRN
  Administered 2013-11-22: 1000 mL

## 2013-11-22 MED ORDER — HYDROMORPHONE HCL PF 1 MG/ML IJ SOLN
0.2500 mg | INTRAMUSCULAR | Status: DC | PRN
  Administered 2013-11-22 (×2): 0.25 mg via INTRAVENOUS

## 2013-11-22 MED ORDER — LIDOCAINE HCL (CARDIAC) 20 MG/ML IV SOLN
INTRAVENOUS | Status: AC
  Filled 2013-11-22: qty 5

## 2013-11-22 MED ORDER — TECHNETIUM TC 99M SULFUR COLLOID FILTERED
1.0000 | Freq: Once | INTRAVENOUS | Status: AC | PRN
  Administered 2013-11-22: 1 via INTRADERMAL

## 2013-11-22 MED ORDER — ONDANSETRON HCL 4 MG/2ML IJ SOLN: 4.0000 mg | Freq: Four times a day (QID) | INTRAMUSCULAR | Status: DC | PRN

## 2013-11-22 MED ORDER — ONDANSETRON HCL 4 MG/2ML IJ SOLN
INTRAMUSCULAR | Status: DC | PRN
  Administered 2013-11-22: 4 mg via INTRAVENOUS

## 2013-11-22 MED ORDER — FENTANYL CITRATE 0.05 MG/ML IJ SOLN
INTRAMUSCULAR | Status: AC
  Filled 2013-11-22: qty 2

## 2013-11-22 SURGICAL SUPPLY — 49 items
ADH SKN CLS APL DERMABOND .7 (GAUZE/BANDAGES/DRESSINGS) ×2
BINDER BREAST LRG (GAUZE/BANDAGES/DRESSINGS) ×1 IMPLANT
BINDER BREAST XLRG (GAUZE/BANDAGES/DRESSINGS) IMPLANT
CANISTER SUCTION 2500CC (MISCELLANEOUS) ×2 IMPLANT
CHLORAPREP W/TINT 26ML (MISCELLANEOUS) ×2 IMPLANT
CLIP TI MEDIUM 6 (CLIP) ×2 IMPLANT
CONT SPEC 4OZ CLIKSEAL STRL BL (MISCELLANEOUS) ×2 IMPLANT
COVER PROBE W GEL 5X96 (DRAPES) ×2 IMPLANT
COVER SURGICAL LIGHT HANDLE (MISCELLANEOUS) ×2 IMPLANT
DERMABOND ADVANCED (GAUZE/BANDAGES/DRESSINGS) ×2
DERMABOND ADVANCED .7 DNX12 (GAUZE/BANDAGES/DRESSINGS) ×1 IMPLANT
DRAIN CHANNEL 19F RND (DRAIN) ×2 IMPLANT
DRAPE CHEST BREAST 15X10 FENES (DRAPES) ×2 IMPLANT
DRAPE UTILITY 15X26 W/TAPE STR (DRAPE) ×4 IMPLANT
ELECT CAUTERY BLADE 6.4 (BLADE) ×2 IMPLANT
ELECT REM PT RETURN 9FT ADLT (ELECTROSURGICAL) ×2
ELECTRODE REM PT RTRN 9FT ADLT (ELECTROSURGICAL) ×1 IMPLANT
EVACUATOR SILICONE 100CC (DRAIN) ×2 IMPLANT
GLOVE BIOGEL PI IND STRL 7.0 (GLOVE) IMPLANT
GLOVE BIOGEL PI IND STRL 7.5 (GLOVE) IMPLANT
GLOVE BIOGEL PI IND STRL 8 (GLOVE) ×1 IMPLANT
GLOVE BIOGEL PI INDICATOR 7.0 (GLOVE) ×1
GLOVE BIOGEL PI INDICATOR 7.5 (GLOVE) ×1
GLOVE BIOGEL PI INDICATOR 8 (GLOVE) ×1
GLOVE SS BIOGEL STRL SZ 7.5 (GLOVE) ×1 IMPLANT
GLOVE SUPERSENSE BIOGEL SZ 7.5 (GLOVE) ×1
GLOVE SURG SS PI 7.0 STRL IVOR (GLOVE) ×1 IMPLANT
GOWN STRL REUS W/ TWL LRG LVL3 (GOWN DISPOSABLE) ×2 IMPLANT
GOWN STRL REUS W/ TWL XL LVL3 (GOWN DISPOSABLE) ×1 IMPLANT
GOWN STRL REUS W/TWL LRG LVL3 (GOWN DISPOSABLE) ×5 IMPLANT
GOWN STRL REUS W/TWL XL LVL3 (GOWN DISPOSABLE) ×2
KIT BASIN OR (CUSTOM PROCEDURE TRAY) ×2 IMPLANT
KIT ROOM TURNOVER OR (KITS) ×2 IMPLANT
NDL 18GX1X1/2 (RX/OR ONLY) (NEEDLE) ×1 IMPLANT
NDL HYPO 25GX1X1/2 BEV (NEEDLE) ×1 IMPLANT
NEEDLE 18GX1X1/2 (RX/OR ONLY) (NEEDLE) ×2 IMPLANT
NEEDLE HYPO 25GX1X1/2 BEV (NEEDLE) ×2 IMPLANT
NS IRRIG 1000ML POUR BTL (IV SOLUTION) ×2 IMPLANT
PACK GENERAL/GYN (CUSTOM PROCEDURE TRAY) ×2 IMPLANT
PAD ABD 8X10 STRL (GAUZE/BANDAGES/DRESSINGS) ×1 IMPLANT
PAD ARMBOARD 7.5X6 YLW CONV (MISCELLANEOUS) ×2 IMPLANT
SPECIMEN JAR X LARGE (MISCELLANEOUS) ×2 IMPLANT
SUT ETHILON 2 0 FS 18 (SUTURE) ×2 IMPLANT
SUT MNCRL AB 4-0 PS2 18 (SUTURE) ×1 IMPLANT
SUT MON AB 5-0 PS2 18 (SUTURE) ×2 IMPLANT
SUT VIC AB 3-0 SH 18 (SUTURE) ×3 IMPLANT
SYR CONTROL 10ML LL (SYRINGE) ×2 IMPLANT
TOWEL OR 17X24 6PK STRL BLUE (TOWEL DISPOSABLE) ×2 IMPLANT
TOWEL OR 17X26 10 PK STRL BLUE (TOWEL DISPOSABLE) ×2 IMPLANT

## 2013-11-22 NOTE — Preoperative (Signed)
Beta Blockers   Reason not to administer Beta Blockers:Not Applicable 

## 2013-11-22 NOTE — Anesthesia Postprocedure Evaluation (Signed)
  Anesthesia Post-op Note  Patient: Crystal Hill  Procedure(s) Performed: Procedure(s): SIMPLE MASTECTOMY WITH AXILLARY SENTINEL NODE BIOPSY (Left)  Patient Location: PACU  Anesthesia Type:General and block  Level of Consciousness: awake and alert   Airway and Oxygen Therapy: Patient Spontanous Breathing  Post-op Pain: mild  Post-op Assessment: Post-op Vital signs reviewed, Patient's Cardiovascular Status Stable and Respiratory Function Stable  Post-op Vital Signs: Reviewed  Filed Vitals:   11/22/13 1730  BP: 115/59  Pulse: 83  Temp:   Resp: 13    Complications: No apparent anesthesia complications

## 2013-11-22 NOTE — Op Note (Signed)
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 After an extensive preoperative evaluation and discussion regarding treatment options which have been extensively detailed elsewhere we elect to proceed with left total mastectomy with sentinel lymph node biopsy and possible axillary dissection. She is brought to the operating room for this procedure.    Procedure Detail:  The patient was brought to the operating room, placed in supine position on the operating table, and laryngeal mask general anesthesia induced. Preoperatively 1 mCi of technetium sulfur colloid had been injected intradermally around the left nipple. Under sterile technique and after sufficient time amount of 5 cc of dilute methylene blue was injected subcutaneously beneath the left nipple massaged for several minutes. The entire left chest and breast, axilla and upper arm were then widely sterilely prepped and draped. She received preoperative IV antibiotics. PAS were in place. Patient timeout was again performed and the procedure verified. I used an elliptical incision encompassing the nipple areolar complex and the skin and subcutaneous flaps were raised superior leaf of the clavicle, inferior ligament or to the rectus, medially to the edge of the sternocleidomastoid and laterally out to the latissimus dorsi his anterior border was exposed. The skin and subcutaneous flap was raised out of the axilla and using the neoprobe a hot blue axillary lymph node was identified and completely excised. A second hot non-blue lymph node was identified and excised. At this point  backgrounds and axilla were less than 100 with the hottest node measuring over 1400. These 2 nodes were sent for immediate diagnosis. While awaiting this the breast was reflected up off of the pectoralis major working superior medial to inferolateral. The attachments along the edge of the pectoralis major and the serratus were divided. The patches along the anterior border of the latissimus were divided and the dissection carried up to the low axilla. The lymph nodes returned as negative for malignancy. I then came across the low axilla with Kelly clamps and the specimen was removed oriented and sent for permanent pathology. These were tied with 3-0 Vicryl. The wound was thoroughly irrigated and hemostasis assured. A 19 Blake closed suction drain was left under both flaps brought through a lateral stab wound. The subcutaneous tissue was closed with interrupted 3-0 Vicryl and the skin with subcuticular 4-0 Monocryl and Dermabond. Sponge needle and instrument counts were correct.    Findings: As above  Estimated Blood Loss:  Minimal         Drains: 19 Blake  Blood Given: none          Specimens: #1 left total mastectomy    #2 hot blue left axillary sentinel lymph node      #3 hot and non-blue left axillary sentinel lymph node        Complications:  * No complications entered in OR log *         Disposition: PACU - hemodynamically stable.         Condition: stable

## 2013-11-22 NOTE — H&P (Signed)
History:   Crystal Hill is a 73 y.o. postmenopausal female referred by Dr. Marcelo Baldy for evaluation of recently diagnosed carcinoma of the left breast. She recently noted a mass in the lateral aspect of her left breast approximately 2 weeks ago. She has a history of fibrocystic breast disease in her 14s and apparently had multiple repeat mammograms at that time and since then has decided and adamantly refuses to have any further mammograms. She presented and therefore initial imaging was ultrasound revealing a 2.2 cm hypoechoic mass superficially in the 9:00 position of the left breast. 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 After extensive pre operative discussion of options detailed elsewhere she has elected to proceed with left total mastectomy with SLN Bx and possible axillary disssection   Findings at that time were the following:  Tumor size: 2.2 cm  Tumor grade: 1  Estrogen Receptor: positive  Progesterone Receptor: positive  Her-2 neu: negative  Lymph node status: negative  Neurovascular invasion: no comment  Lymphatic invasion: no comment   Past Medical History   Diagnosis  Date   .  Pulmonary nodule    .  Chronic sinusitis    .  History of recurrent pneumonia    .  Tobacco abuse    .  Nephrolithiasis     Past Surgical History   Procedure  Laterality  Date   .  Partial hysterectomy     .  Tubal ligation      Current Outpatient Prescriptions   Medication  Sig  Dispense  Refill   .  Azelaic Acid (FINACEA) 15 % cream  As directed     .  azelastine (ASTELIN) 137 MCG/SPRAY nasal spray  Place 1 spray into the nose 2 (two) times daily. Use in each nostril as directed     .  cholecalciferol (VITAMIN D) 1000 UNITS tablet  2 tablets daily     .  Clemastine Fumarate 1.34 MG TABS  1/2 to 1 tablet daily     .  Docosahexaenoic Acid (DHA COMPLETE PO)  800 mg daily     .  estradiol (ESTRACE) 1 MG  tablet  Take 1 mg by mouth daily.     .  Lactobacillus (ACIDOPHILUS) CAPS  Once a day     .  Lysine 1000 MG TABS  Once a day     .  medroxyPROGESTERone (PROVERA) 2.5 MG tablet  Take 2.5 mg by mouth daily.     .  mometasone (NASONEX) 50 MCG/ACT nasal spray  Place 2 sprays into the nose daily.     .  Multiple Vitamin (MULTIVITAMIN) tablet  Take 1 tablet by mouth daily.     .  Omega-3 Fatty Acids (EPA PO)  1600 mg daily     .  pseudoephedrine (SUDAFED) 30 MG tablet  Take 30 mg by mouth every 4 (four) hours as needed.     .  vitamin C (ASCORBIC ACID) 500 MG tablet  Take 500 mg by mouth daily.     .  zaleplon (SONATA) 10 MG capsule  Take 10 mg by mouth at bedtime.      No current facility-administered medications for this visit.    Family History   Problem  Relation  Age of Onset   .  Cancer      History    Social History   .  Marital Status:  Widowed     Spouse Name:  N/A     Number of Children:  N/A   .  Years of Education:  N/A    Social History Main Topics   .  Smoking status:  Current Every Day Smoker -- 2.00 packs/day     Types:  Cigarettes   .  Smokeless tobacco:  Never Used   .  Alcohol Use:  Not on file   .  Drug Use:  Not on file   .  Sexual Activity:  Not on file    Other Topics  Concern   .  Not on file    Social History Narrative      She lives alone and cares for her 64 year old mother who is under hospice care.  Review of Systems  A comprehensive review of systems was negative.  Objective:   BP 124/38  Pulse 77  Temp(Src) 97.9 F (36.6 C) (Oral)  Resp 17  SpO2 99%  General: Alert, well-developed a thin Caucasian female, in no distress  Skin: Warm and dry without rash or infection.  HEENT: No palpable masses or thyromegaly. Sclera nonicteric. Pupils equal round and reactive. Oropharynx clear.  Breasts: there is some bruising and thickening of the outer left breast post biopsy. There is a likely mass at the 9:00 position but this is somewhat difficult to  determine due to due postbiopsy swelling. I cannot feel any axillary lymph nodes.  Lymph nodes: No cervical, supraclavicular, or inguinal nodes palpable.  Lungs: Breath sounds clear and equal without increased work of breathing  Cardiovascular: Regular rate and rhythm without murmur. No JVD or edema. Peripheral pulses intact.  Abdomen: Nondistended. Soft and nontender. No masses palpable. No organomegaly. No palpable hernias.  Extremities: No edema or joint swelling or deformity. No chronic venous stasis changes.  Neurologic: Alert and fully oriented. Gait normal.   Laboratory data:  CBC:  Lab Results   Component  Value  Date    WBC  5.2  03/21/2011    RBC  4.46  03/21/2011    HGB  15.6*  03/21/2011    HCT  46.0  03/21/2011    PLT  122*  03/21/2011   ]  [facility] Labs:  Lab Results   Component  Value  Date    NA  142  03/21/2011    K  3.8  03/21/2011    CL  106  03/21/2011    CO2  25  03/01/2009    BUN  12  03/21/2011    CREATININE  0.90  03/21/2011    CALCIUM  9.8  03/01/2009    PROT  7.0  03/21/2011    BILITOT  0.2*  03/21/2011    BILIDIR  0.1  03/21/2011    ALKPHOS  95  03/21/2011    AST  28  03/21/2011    ALT  31  03/21/2011    A/P  For left total mastectomy and SLN Bx, possible axillary dissection  Edward Jolly MD, FACS  11/22/2013, 1:03 PM

## 2013-11-22 NOTE — Transfer of Care (Signed)
Immediate Anesthesia Transfer of Care Note  Patient: Crystal Hill  Procedure(s) Performed: Procedure(s): SIMPLE MASTECTOMY WITH AXILLARY SENTINEL NODE BIOPSY (Left)  Patient Location: PACU  Anesthesia Type:GA combined with regional for post-op pain  Level of Consciousness: sedated and responds to stimulation  Airway & Oxygen Therapy: Patient Spontanous Breathing and Patient connected to nasal cannula oxygen  Post-op Assessment: Report given to PACU RN, Post -op Vital signs reviewed and stable and Patient moving all extremities  Post vital signs: Reviewed and stable  Complications: No apparent anesthesia complications

## 2013-11-22 NOTE — Anesthesia Procedure Notes (Addendum)
Anesthesia Regional Block:  Pectoralis block  Pre-Anesthetic Checklist: ,, timeout performed, Correct Patient, Correct Site, Correct Laterality, Correct Procedure, Correct Position, site marked, Risks and benefits discussed, pre-op evaluation, post-op pain management  Laterality: Left  Prep: Maximum Sterile Barrier Precautions used and chloraprep       Needles:  Injection technique: Single-shot  Needle Type: Echogenic Stimulator Needle     Needle Length: 9cm 9 cm Needle Gauge: 21 and 21 G    Additional Needles:  Procedures: ultrasound guided (picture in chart) Pectoralis block Narrative:  Start time: 11/22/2013 12:00 PM End time: 11/22/2013 12:11 PM Injection made incrementally with aspirations every 5 mL. Anesthesiologist: Fitzgerald,MD  Additional Notes: 2% Lidocaine skin wheel.   Procedure Name: LMA Insertion Date/Time: 11/22/2013 1:22 PM Performed by: Julian Reil Pre-anesthesia Checklist: Patient identified, Emergency Drugs available, Suction available and Patient being monitored Patient Re-evaluated:Patient Re-evaluated prior to inductionOxygen Delivery Method: Circle system utilized Preoxygenation: Pre-oxygenation with 100% oxygen Intubation Type: IV induction Ventilation: Mask ventilation without difficulty LMA: LMA inserted LMA Size: 4.0 Tube type: Oral Number of attempts: 1 Placement Confirmation: positive ETCO2 and breath sounds checked- equal and bilateral Tube secured with: Tape Dental Injury: Teeth and Oropharynx as per pre-operative assessment

## 2013-11-22 NOTE — Anesthesia Preprocedure Evaluation (Signed)
Anesthesia Evaluation  Patient identified by MRN, date of birth, ID band Patient awake    Reviewed: Allergy & Precautions, H&P , NPO status , Patient's Chart, lab work & pertinent test results  History of Anesthesia Complications (+) PONV and history of anesthetic complications  Airway Mallampati: II TM Distance: >3 FB Neck ROM: Full    Dental no notable dental hx. (+) Teeth Intact, Dental Advisory Given   Pulmonary Current Smoker,  breath sounds clear to auscultation  Pulmonary exam normal       Cardiovascular negative cardio ROS  Rhythm:Regular Rate:Normal     Neuro/Psych Anxiety negative neurological ROS     GI/Hepatic negative GI ROS, Neg liver ROS,   Endo/Other  negative endocrine ROS  Renal/GU negative Renal ROS  negative genitourinary   Musculoskeletal   Abdominal   Peds  Hematology negative hematology ROS (+)   Anesthesia Other Findings   Reproductive/Obstetrics negative OB ROS                           Anesthesia Physical Anesthesia Plan  ASA: II  Anesthesia Plan: General and Regional   Post-op Pain Management:    Induction: Intravenous  Airway Management Planned: LMA  Additional Equipment:   Intra-op Plan:   Post-operative Plan: Extubation in OR  Informed Consent: I have reviewed the patients History and Physical, chart, labs and discussed the procedure including the risks, benefits and alternatives for the proposed anesthesia with the patient or authorized representative who has indicated his/her understanding and acceptance.   Dental advisory given  Plan Discussed with: CRNA  Anesthesia Plan Comments:         Anesthesia Quick Evaluation

## 2013-11-23 ENCOUNTER — Encounter (HOSPITAL_COMMUNITY): Payer: Self-pay | Admitting: *Deleted

## 2013-11-23 LAB — CBC
HCT: 38.3 % (ref 36.0–46.0)
HEMOGLOBIN: 13.2 g/dL (ref 12.0–15.0)
MCH: 32.9 pg (ref 26.0–34.0)
MCHC: 34.5 g/dL (ref 30.0–36.0)
MCV: 95.5 fL (ref 78.0–100.0)
Platelets: 142 10*3/uL — ABNORMAL LOW (ref 150–400)
RBC: 4.01 MIL/uL (ref 3.87–5.11)
RDW: 14 % (ref 11.5–15.5)
WBC: 7 10*3/uL (ref 4.0–10.5)

## 2013-11-23 NOTE — Discharge Summary (Signed)
   Patient ID: Crystal Hill [id number] 72 y.o. 08/29/1941  11/22/2013  Discharge date and time: 11/23/2013   Admitting Physician: Excell Seltzer T  Discharge Physician: Excell Seltzer T  Admission Diagnoses: cancer of left breast  Discharge Diagnoses: same  Operations: Procedure(s): SIMPLE MASTECTOMY WITH AXILLARY SENTINEL NODE BIOPSY  Admission Condition: good  Discharged Condition: good  Indication for Admission: patient is a 73 year old female with a recent diagnosis of stage IB invasive ductal carcinoma of the left breast. After extensive preoperative discussion and consultation detailed elsewhere we have elected to proceed with left total mastectomy and axillary sentinel lymph node biopsy as initial surgical treatment and she was admitted following this procedure for overnight observation.  Hospital Course: on the morning of admission the patient underwent an uneventful left simple mastectomy and left axillary sentinel lymph node biopsy with touch prep negative for malignancy. She tolerated the procedure well. On the first postoperative day she is very comfortable and not requiring any pain medications. Her incision is clean and dry and there was no bleeding or swelling and JP drainage is minimal. She is felt ready for discharge.   Disposition: Home  Patient Instructions:    Medication List         Acidophilus Caps capsule  Take 1 capsule by mouth daily. Once a day     azelastine 137 MCG/SPRAY nasal spray  Commonly known as:  ASTELIN  Place 1 spray into the nose daily as needed. Use in each nostril as directed     cholecalciferol 1000 UNITS tablet  Commonly known as:  VITAMIN D  Take 1,000 Units by mouth daily. 2 tablets daily     DHA COMPLETE PO  Take 2 capsules by mouth 2 (two) times daily.     FINACEA 15 % cream  Generic drug:  Azelaic Acid  Apply 1 application topically daily. As directed     Lysine 1000 MG Tabs  Take 1,000 mg by mouth daily.  Once a day     mometasone 50 MCG/ACT nasal spray  Commonly known as:  NASONEX  Place 2 sprays into the nose daily as needed.     multivitamin tablet  Take 2 tablets by mouth daily.     pregabalin 75 MG capsule  Commonly known as:  LYRICA  Take 75 mg by mouth daily.     venlafaxine XR 75 MG 24 hr capsule  Commonly known as:  EFFEXOR-XR  Take 75 mg by mouth 2 (two) times daily.     vitamin C 500 MG tablet  Commonly known as:  ASCORBIC ACID  Take 500 mg by mouth daily.     zaleplon 10 MG capsule  Commonly known as:  SONATA  Take 10 mg by mouth at bedtime.        Activity: activity as tolerated Diet: regular diet Wound Care: empty JP drain daily and record amount  Follow-up:  With Dr. Excell Seltzer in 2 weeks.  Signed: Edward Jolly MD, FACS  11/23/2013, 7:55 AM

## 2013-11-23 NOTE — Progress Notes (Signed)
Dc home with daughter, verbally understood DC instructions, no questions ask, instrucuted on drain care, supplies given to pt.Crystal Hill

## 2013-11-23 NOTE — Discharge Instructions (Signed)
[facility] [phone]  MASTECTOMY: POST OP INSTRUCTIONS  Always review your discharge instruction sheet given to you by the facility where your surgery was performed. IF YOU HAVE DISABILITY OR FAMILY LEAVE FORMS, YOU MUST BRING THEM TO THE OFFICE FOR PROCESSING.   DO NOT GIVE THEM TO YOUR DOCTOR. A prescription for pain medication may be given to you upon discharge.  Take your pain medication as prescribed, if needed.  If narcotic pain medicine is not needed, then you may take acetaminophen (Tylenol) or ibuprofen (Advil) as needed. 1. Take your usually prescribed medications unless otherwise directed. 2. If you need a refill on your pain medication, please contact your pharmacy.  They will contact our office to request authorization.  Prescriptions will not be filled after 5pm or on week-ends. 3. You should follow a light diet the first few days after arrival home, such as soup and crackers, etc.  Resume your normal diet the day after surgery. 4. Most patients will experience some swelling and bruising on the chest and underarm.  Ice packs will help.  Swelling and bruising can take several days to resolve.  5. It is common to experience some constipation if taking pain medication after surgery.  Increasing fluid intake and taking a stool softener (such as Colace) will usually help or prevent this problem from occurring.  A mild laxative (Milk of Magnesia or Miralax) should be taken according to package instructions if there are no bowel movements after 48 hours. 6. Unless discharge instructions indicate otherwise, leave your bandage dry and in place until your next appointment in 3-5 days.  You may take a limited sponge bath.  No tube baths or showers until the drains are removed.  You may have steri-strips (small skin tapes) in place directly over the incision.  These strips should be left on the skin for 7-10 days.  If your surgeon used skin glue on the incision, you may  shower in 24 hours.  The glue will flake off over the next 2-3 weeks.  Any sutures or staples will be removed at the office during your follow-up visit. 7. DRAINS:  If you have drains in place, it is important to keep a list of the amount of drainage produced each day in your drains.  Before leaving the hospital, you should be instructed on drain care.  Call our office if you have any questions about your drains. 8. ACTIVITIES:  You may resume regular (light) daily activities beginning the next day--such as daily self-care, walking, climbing stairs--gradually increasing activities as tolerated.  You may have sexual intercourse when it is comfortable.  Refrain from any heavy lifting or straining until approved by your doctor. a. You may drive when you are no longer taking prescription pain medication, you can comfortably wear a seatbelt, and you can safely maneuver your car and apply brakes. b. RETURN TO WORK:  __________________________________________________________ 9. You should see your doctor in the office for a follow-up appointment approximately 3-5 days after your surgery.  Your doctors nurse will typically make your follow-up appointment when she calls you with your pathology report.  Expect your pathology report 2-3 business days after your surgery.  You may call to check if you do not hear from [place] after three days.   10. OTHER INSTRUCTIONS: ______________________________________________________________________________________________ ____________________________________________________________________________________________ WHEN TO CALL YOUR DOCTOR: 1. Fever over 101.0 2. Nausea and/or vomiting 3. Extreme swelling or bruising 4. Continued bleeding from incision. 5. Increased pain, redness, or drainage from the incision.  6. If JP drainage is less than 20 cc per day, you may call the office for a nurse only visit to remove the drain The clinic staff is available to answer your questions  during regular business hours.  Please dont hesitate to call and ask to speak to one of the nurses for clinical concerns.  If you have a medical emergency, go to the nearest emergency room or call 911.  A surgeon from [facility] is always on call at the hospital. [address], [place], [place], [place]  [address] ? [address], [place], [place]   [address] [phone] ? [phone] ? FAX [phone] Web site: [email or web address]

## 2013-11-24 ENCOUNTER — Encounter (HOSPITAL_COMMUNITY): Payer: Self-pay | Admitting: General Surgery

## 2013-11-25 ENCOUNTER — Telehealth (INDEPENDENT_AMBULATORY_CARE_PROVIDER_SITE_OTHER): Payer: Self-pay | Admitting: General Surgery

## 2013-11-25 NOTE — Telephone Encounter (Signed)
Called the patient and discussed her pathology report

## 2013-11-28 ENCOUNTER — Telehealth (INDEPENDENT_AMBULATORY_CARE_PROVIDER_SITE_OTHER): Payer: Self-pay

## 2013-11-28 NOTE — Telephone Encounter (Signed)
Message copied by Ivor Costa on Mon Nov 28, 2013  1:22 PM ------      Message from: Jeanella Craze      Created: Thu Nov 24, 2013  9:24 AM      Regarding: [place]      Contact: [phone]       Patient will need to follow up in 2 weeks-Bilateral Mastectomy-First available is 12/23/13-She doesn't want to wait that long-Please call patient and inform her of the appointment because she wants to hear from you. Thank you. ------

## 2013-11-28 NOTE — Telephone Encounter (Signed)
Called and spoke to patient with post op appointment date & time for 12/07/13 @ 3:15pm.  Patient reports that her output on JP Drain is still above 50 mL's.  Patient advised to call our office once her out put has decreased and we will schedule a nurse visit for drain removal.

## 2013-12-01 ENCOUNTER — Other Ambulatory Visit (INDEPENDENT_AMBULATORY_CARE_PROVIDER_SITE_OTHER): Payer: Self-pay | Admitting: *Deleted

## 2013-12-01 MED ORDER — UNABLE TO FIND: Status: DC

## 2013-12-02 ENCOUNTER — Encounter (INDEPENDENT_AMBULATORY_CARE_PROVIDER_SITE_OTHER): Payer: Self-pay | Admitting: General Surgery

## 2013-12-02 ENCOUNTER — Ambulatory Visit (INDEPENDENT_AMBULATORY_CARE_PROVIDER_SITE_OTHER): Payer: Medicare Other | Admitting: General Surgery

## 2013-12-02 VITALS — BP 128/83 | HR 82 | Temp 98.4°F | Resp 16 | Ht 66.0 in | Wt 132.0 lb

## 2013-12-02 DIAGNOSIS — Z4803 Encounter for change or removal of drains: Secondary | ICD-10-CM

## 2013-12-02 DIAGNOSIS — Z4889 Encounter for other specified surgical aftercare: Secondary | ICD-10-CM

## 2013-12-02 NOTE — Patient Instructions (Signed)
Patient came in for a drain removal, the patient stated for the last 48 hours that she was getting in her drain was about 4 cc. I pulled the drain out and placed a dry gauze over the drain site and I told her that she can have a shower and let the water over the wound and pat it dry and place a dry dressing over the drain site until it closes up. And keep her apt next week with Dr Excell Seltzer

## 2013-12-07 ENCOUNTER — Encounter (INDEPENDENT_AMBULATORY_CARE_PROVIDER_SITE_OTHER): Payer: Self-pay | Admitting: General Surgery

## 2013-12-07 ENCOUNTER — Ambulatory Visit (INDEPENDENT_AMBULATORY_CARE_PROVIDER_SITE_OTHER): Payer: Medicare Other | Admitting: General Surgery

## 2013-12-07 VITALS — BP 126/82 | HR 80 | Temp 98.3°F | Resp 16 | Wt 132.6 lb

## 2013-12-07 DIAGNOSIS — C50419 Malignant neoplasm of upper-outer quadrant of unspecified female breast: Secondary | ICD-10-CM

## 2013-12-07 NOTE — Progress Notes (Signed)
History: Patient returned to the office for followup of left total mastectomy with negative sentinel lymph node biopsy, surgery date of 11/22/2013. She feels that she's gotten along very well with no discomfort at all and is very happy with the regional block she received. She does notice some puffiness at the incision.  Exam: BP 126/82  Pulse 80  Temp(Src) 98.3 F (36.8 C) (Temporal)  Resp 16  Wt 132 lb 9.6 oz (60.147 kg) General: Appears well Chest wall: The incision is nicely healed without infection but there is a small seroma. I recommended aspiration under sterile technique aspirated 40 cc of clear serosanguineous fluid with resolution.  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 There was perineural invasion. Resection margins were negative.  Assessment and plan: Doing well following left total mastectomy for T1 C. N0 carcinoma the left breast. She has an appointment with medical oncology pending. I'll see her back in 3 weeks to check her wound due to seroma.

## 2013-12-08 ENCOUNTER — Encounter: Payer: Self-pay | Admitting: *Deleted

## 2013-12-09 ENCOUNTER — Telehealth: Payer: Self-pay | Admitting: Oncology

## 2013-12-09 NOTE — Telephone Encounter (Signed)
s.w. pt and advised on April appt....ok and aware

## 2013-12-12 ENCOUNTER — Telehealth: Payer: Self-pay

## 2013-12-12 NOTE — Telephone Encounter (Signed)
Advised pt that appt on 4/13 was for follow up after surgery to ensure pt is doing ok.  Confirmed appt d/t.  Pt voiced understanding.

## 2013-12-13 ENCOUNTER — Telehealth: Payer: Self-pay | Admitting: *Deleted

## 2013-12-13 NOTE — Telephone Encounter (Signed)
Called and informed pt that Santiago Glad would no longer be here and that I needed to reschedule.  Confirmed 4/9 genetic appt w/ pt and mailed pt a new calendar.

## 2013-12-16 ENCOUNTER — Telehealth: Payer: Self-pay | Admitting: *Deleted

## 2013-12-16 NOTE — Telephone Encounter (Signed)
Mailed after appt letter to pt. 

## 2013-12-22 ENCOUNTER — Other Ambulatory Visit: Payer: Medicare Other

## 2013-12-22 ENCOUNTER — Ambulatory Visit (HOSPITAL_BASED_OUTPATIENT_CLINIC_OR_DEPARTMENT_OTHER): Payer: Medicare Other | Admitting: Genetic Counselor

## 2013-12-22 ENCOUNTER — Encounter: Payer: Self-pay | Admitting: Genetic Counselor

## 2013-12-22 DIAGNOSIS — Z803 Family history of malignant neoplasm of breast: Secondary | ICD-10-CM | POA: Diagnosis not present

## 2013-12-22 DIAGNOSIS — C50919 Malignant neoplasm of unspecified site of unspecified female breast: Secondary | ICD-10-CM | POA: Diagnosis not present

## 2013-12-22 DIAGNOSIS — IMO0002 Reserved for concepts with insufficient information to code with codable children: Secondary | ICD-10-CM

## 2013-12-22 NOTE — Progress Notes (Signed)
Patient Name: Crystal Hill Patient Age: 73 y.o. Encounter Date: 12/22/2013  Referring Physician: Leanna Battles, MD [address] [place], [place] [address]  Primary Care Provider: Donnajean Lopes, MD  Medical Oncologist: Marcy Panning, MD  Surgeon: Excell Seltzer, MD  Ms. Crystal Hill, a 73 y.o. female, is being seen at the [facility] due to a personal and family history of breast cancer.  She presents to clinic today to discuss the possibility of a hereditary predisposition to cancer and discuss whether genetic testing is warranted.  HISTORY OF PRESENT ILLNESS: 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 She is s/p left mastectomy. The breast tumor was ER positive, PR positive, Her2Neu negative.  She states that she had her ovaries removed around age 70. She states, however, that she reached menopause in her 74s after her husband's death and was on HRT from that time until diagnosis. She reports a colonoscopy 4 years ago found "a couple of tiny polyps".   Past Medical History  Diagnosis Date  . Pulmonary nodule   . Chronic sinusitis   . History of recurrent pneumonia   . Tobacco abuse   . Nephrolithiasis   . Breast cancer     left  . PONV (postoperative nausea and vomiting)   . Pneumonia     hx  . Anxiety     Past Surgical History  Procedure Laterality Date  . Partial hysterectomy    . Tubal ligation    . Joint replacement      shoulder x 3 recon  . Simple mastectomy with axillary sentinel node biopsy Left 11/22/2013    Procedure: SIMPLE MASTECTOMY WITH AXILLARY SENTINEL NODE BIOPSY;  Surgeon: Edward Jolly, MD;  Location: [facility];  Service: General;  Laterality: Left;  . Breast surgery      History   Social History  . Marital Status: Widowed    Spouse Name: N/A    Number of Children: N/A  . Years of Education: N/A   Social History Main Topics  . Smoking status: Current Every Day Smoker -- 2.00  packs/day for 55 years    Types: Cigarettes  . Smokeless tobacco: Never Used  . Alcohol Use: Yes     Comment: occ  . Drug Use: No  . Sexual Activity: Not Currently     Comment: menarche age 57, P50, first live birth age 79, menopause 37, HRT x 35 yrs   Other Topics Concern  . Not on file   Social History Narrative  . No narrative on file     FAMILY HISTORY:   During the visit, a 4-generation pedigree was obtained. Significant diagnoses include the following:  Family History  Problem Relation Age of Onset  . COPD Mother   . Lung cancer Father 33    deceased 62  . Breast cancer Maternal Aunt 90    living in her 51s  . Cancer Paternal Aunt 4    not sure; maybe breast; died at 61  . Prostate cancer Maternal Grandfather     died at 75    Additionally, please note that her paternal aunt may not have had breast cancer. She lived to age 10.  Ms. Laffey ancestry is [place], [place], [place] and [place]. There is no known Jewish ancestry and no consanguinity.  ASSESSMENT AND PLAN: [name] is a 73 y.o. female with a personal and family history of breast cancer at later ages-of-onset. This history is not suggestive  of a hereditary predisposition to cancer. We reviewed the characteristics, features and inheritance patterns of hereditary cancer syndromes to explain why she has a low chance of harboring a mutation. We discussed with Ms. Kall that the family history is not consistent with a familial hereditary cancer syndrome, and that genetic testing is not recommend at this time. We will defer her screenings to her overseeing providers.   We encouraged Ms. Rubert to remain in contact with Cancer Genetics annually so that we can update the family history and inform her of any changes in cancer genetics and testing that may be of benefit for this family. Ms.  Friend questions were answered to her satisfaction today.   Thank you for the referral and allowing [place] to share in the care  of your patient.   The patient was seen for a total of 30 minutes, greater than 50% of which was spent face-to-face counseling. This patient was discussed with the referring provider who agrees with the above.

## 2013-12-23 ENCOUNTER — Ambulatory Visit (INDEPENDENT_AMBULATORY_CARE_PROVIDER_SITE_OTHER): Payer: Medicare Other | Admitting: General Surgery

## 2013-12-23 ENCOUNTER — Encounter (INDEPENDENT_AMBULATORY_CARE_PROVIDER_SITE_OTHER): Payer: Self-pay | Admitting: General Surgery

## 2013-12-23 VITALS — BP 124/80 | HR 77 | Temp 97.8°F | Wt 134.8 lb

## 2013-12-23 DIAGNOSIS — IMO0002 Reserved for concepts with insufficient information to code with codable children: Secondary | ICD-10-CM

## 2013-12-23 NOTE — Progress Notes (Signed)
Chief complaint: Followup mastectomy  History: Patient returns following left mastectomy. Generally feeling well. She has noted some recurrent swelling at the incision.  Exam: BP 124/80  Pulse 77  Temp(Src) 97.8 F (36.6 C) (Oral)  Wt 134 lb 12.8 oz (61.145 kg) There is a recurrent moderate seroma. I aspirated about 40 cc of serosanguineous fluid with complete resolution.  Assessment and plan: Doing well other than seroma which should resolve. She has an appointment at the cancer center next week to discuss hormonal therapy. Return in 3 weeks.

## 2013-12-26 ENCOUNTER — Telehealth: Payer: Self-pay | Admitting: Oncology

## 2013-12-26 ENCOUNTER — Encounter: Payer: Medicare Other | Admitting: Genetic Counselor

## 2013-12-26 ENCOUNTER — Ambulatory Visit: Payer: Medicare Other | Admitting: Oncology

## 2013-12-26 ENCOUNTER — Other Ambulatory Visit: Payer: Medicare Other

## 2013-12-27 DIAGNOSIS — IMO0001 Reserved for inherently not codable concepts without codable children: Secondary | ICD-10-CM | POA: Diagnosis not present

## 2013-12-27 DIAGNOSIS — M545 Low back pain, unspecified: Secondary | ICD-10-CM | POA: Diagnosis not present

## 2013-12-27 DIAGNOSIS — M542 Cervicalgia: Secondary | ICD-10-CM | POA: Diagnosis not present

## 2013-12-27 DIAGNOSIS — M999 Biomechanical lesion, unspecified: Secondary | ICD-10-CM | POA: Diagnosis not present

## 2013-12-27 DIAGNOSIS — M546 Pain in thoracic spine: Secondary | ICD-10-CM | POA: Diagnosis not present

## 2013-12-27 DIAGNOSIS — M9981 Other biomechanical lesions of cervical region: Secondary | ICD-10-CM | POA: Diagnosis not present

## 2013-12-30 ENCOUNTER — Ambulatory Visit: Payer: Medicare Other | Admitting: Oncology

## 2013-12-30 ENCOUNTER — Telehealth (INDEPENDENT_AMBULATORY_CARE_PROVIDER_SITE_OTHER): Payer: Self-pay | Admitting: General Surgery

## 2013-12-30 ENCOUNTER — Encounter: Payer: Self-pay | Admitting: Oncology

## 2013-12-30 VITALS — BP 136/70 | HR 102 | Temp 98.3°F | Resp 18 | Ht 66.0 in | Wt 135.6 lb

## 2013-12-30 DIAGNOSIS — C50419 Malignant neoplasm of upper-outer quadrant of unspecified female breast: Secondary | ICD-10-CM

## 2013-12-30 NOTE — Telephone Encounter (Signed)
Please call patient on Monday , patient is very very upset with the cancer center. I told patient that Dr. Excell Seltzer or Alyse Low will call her on Monday

## 2014-01-03 DIAGNOSIS — M546 Pain in thoracic spine: Secondary | ICD-10-CM | POA: Diagnosis not present

## 2014-01-03 DIAGNOSIS — M545 Low back pain, unspecified: Secondary | ICD-10-CM | POA: Diagnosis not present

## 2014-01-03 DIAGNOSIS — M999 Biomechanical lesion, unspecified: Secondary | ICD-10-CM | POA: Diagnosis not present

## 2014-01-03 DIAGNOSIS — IMO0001 Reserved for inherently not codable concepts without codable children: Secondary | ICD-10-CM | POA: Diagnosis not present

## 2014-01-03 DIAGNOSIS — M542 Cervicalgia: Secondary | ICD-10-CM | POA: Diagnosis not present

## 2014-01-03 DIAGNOSIS — M9981 Other biomechanical lesions of cervical region: Secondary | ICD-10-CM | POA: Diagnosis not present

## 2014-01-04 ENCOUNTER — Telehealth (INDEPENDENT_AMBULATORY_CARE_PROVIDER_SITE_OTHER): Payer: Self-pay

## 2014-01-04 NOTE — Telephone Encounter (Signed)
Late Entry:  Called and spoke to patient regarding her concerns with treatment post mastectomy.  Patient has requested a referral to [facility] or another facility for Oncology.  Patient has appointment on 01/06/14 w/Dr. Excell Seltzer and will discuss at the time of her office visit.

## 2014-01-06 ENCOUNTER — Encounter (INDEPENDENT_AMBULATORY_CARE_PROVIDER_SITE_OTHER): Payer: Self-pay | Admitting: General Surgery

## 2014-01-06 ENCOUNTER — Ambulatory Visit (INDEPENDENT_AMBULATORY_CARE_PROVIDER_SITE_OTHER): Payer: Medicare Other | Admitting: General Surgery

## 2014-01-06 VITALS — BP 126/72 | HR 74 | Temp 97.2°F | Ht 66.0 in | Wt 135.0 lb

## 2014-01-06 DIAGNOSIS — IMO0002 Reserved for concepts with insufficient information to code with codable children: Secondary | ICD-10-CM

## 2014-01-06 NOTE — Progress Notes (Signed)
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 She has had a postop seroma that we have aspirated on a few occasions. She feels this has returned. She has followed up at the [facility] and apparently tamoxifen was recommended. She was not happy with her visit or recommendation and would like another opinion with medical oncology in [facility]. I believe an oncotype was performed although I do not have access to this  on the computer today.  Exam: BP 126/72  Pulse 74  Temp(Src) 97.2 F (36.2 C)  Ht 5\' 6"  (1.676 m)  Wt 135 lb (61.236 kg)  BMI 21.80 kg/m2 General: Appears well Incision: There is a recurrent seroma which is smaller than last visit. I aspirated 20 cc of serosanguineous fluid.  Assessment and plan: Postop seroma which is steadily decreasing. I will see her back in 3 weeks. She has medical oncology followup arranged.

## 2014-01-10 DIAGNOSIS — M545 Low back pain, unspecified: Secondary | ICD-10-CM | POA: Diagnosis not present

## 2014-01-10 DIAGNOSIS — M999 Biomechanical lesion, unspecified: Secondary | ICD-10-CM | POA: Diagnosis not present

## 2014-01-10 DIAGNOSIS — M546 Pain in thoracic spine: Secondary | ICD-10-CM | POA: Diagnosis not present

## 2014-01-10 DIAGNOSIS — IMO0001 Reserved for inherently not codable concepts without codable children: Secondary | ICD-10-CM | POA: Diagnosis not present

## 2014-01-10 DIAGNOSIS — M9981 Other biomechanical lesions of cervical region: Secondary | ICD-10-CM | POA: Diagnosis not present

## 2014-01-10 DIAGNOSIS — M542 Cervicalgia: Secondary | ICD-10-CM | POA: Diagnosis not present

## 2014-01-17 ENCOUNTER — Other Ambulatory Visit (INDEPENDENT_AMBULATORY_CARE_PROVIDER_SITE_OTHER): Payer: Self-pay

## 2014-01-17 ENCOUNTER — Telehealth (INDEPENDENT_AMBULATORY_CARE_PROVIDER_SITE_OTHER): Payer: Self-pay | Admitting: *Deleted

## 2014-01-17 DIAGNOSIS — C50919 Malignant neoplasm of unspecified site of unspecified female breast: Secondary | ICD-10-CM | POA: Diagnosis not present

## 2014-01-17 NOTE — Telephone Encounter (Signed)
I actually spoke to pt and she was at the dr office hooked up on machine and she asked me to call her and leave her a detailed message.  Her appt with Hematology/Oncology is 01-31-14 @ 2:00 and to arrive 15 mins early.  Address is [address] [place], [place] .[place] Phone is [phone].[place]

## 2014-01-18 ENCOUNTER — Telehealth: Payer: Self-pay | Admitting: *Deleted

## 2014-01-18 NOTE — Telephone Encounter (Signed)
Received referral from [facility] for this patient to be seen in [facility].  Called pathology at [facility] - no Oncotype DX done and Dr. Marin Olp needed this.  Paperwork filled out with [facility] and faxed today for [employer].  Called patient to explain to her why there would be a delay in seeing her.  Patient very confused about why I was calling and angry that [facility] was contacting her.  She told me that when she saw Dr. Humphrey Rolls she did not have a good experience and stated she would not ever want to be seen at [facility] again.  Was not sure why Dr. Excell Seltzer sent a referral to [place]. She has an appt with Dr. Chauncy Passy at [facility] on 01/28/14.  I told her I would call Dr. Joneen Caraway to see if he still wanted the OncoType Dx done on patient.  Called Dr. Ina Homes breast navigator Kem Boroughs at [phone] left message for her to call us back.

## 2014-01-19 ENCOUNTER — Telehealth: Payer: Self-pay | Admitting: *Deleted

## 2014-01-19 NOTE — Telephone Encounter (Signed)
Called Kem Boroughs the breast cancer navigator at [facility] for Dr. Wendee Beavers.  Asked her if Dr. Wendee Beavers would want to continue with OncoType DX.  She said he would like to continue with it.  Called [facility] pathology to ask them to put Dr. Hanley Hays name to have report faxed to him.  Called patient to let her know what all had transpired.

## 2014-01-20 ENCOUNTER — Encounter (INDEPENDENT_AMBULATORY_CARE_PROVIDER_SITE_OTHER): Payer: Self-pay | Admitting: General Surgery

## 2014-01-20 ENCOUNTER — Ambulatory Visit (INDEPENDENT_AMBULATORY_CARE_PROVIDER_SITE_OTHER): Payer: Medicare Other | Admitting: General Surgery

## 2014-01-20 VITALS — BP 126/80 | HR 78 | Temp 97.0°F | Resp 16 | Ht 67.0 in | Wt 135.8 lb

## 2014-01-20 DIAGNOSIS — IMO0002 Reserved for concepts with insufficient information to code with codable children: Secondary | ICD-10-CM

## 2014-01-20 NOTE — Progress Notes (Signed)
History: Patient returns for further followup status post left total mastectomy with postop seroma. She is frustrated that she can't wear her prosthesis yet because of the seroma.  Exam: There is again a small seroma noted, somewhat smaller than previously. This was aspirated out I obtained a 17 cc of serosanguineous fluid.   Assessment plan: does post total mastectomy negative sentinel lymph node biopsy. Postop seroma. She is concerned about some excess skin at the mastectomy site. I think this is hard to interpret until we get her seroma resolved and the skin flaps had a chance to adhere to the chest wall. This appears to be gradually decreasing. She has followup scheduled with medical oncology. I will see her back in 2 weeks.

## 2014-01-24 DIAGNOSIS — M546 Pain in thoracic spine: Secondary | ICD-10-CM | POA: Diagnosis not present

## 2014-01-24 DIAGNOSIS — M545 Low back pain, unspecified: Secondary | ICD-10-CM | POA: Diagnosis not present

## 2014-01-24 DIAGNOSIS — IMO0001 Reserved for inherently not codable concepts without codable children: Secondary | ICD-10-CM | POA: Diagnosis not present

## 2014-01-24 DIAGNOSIS — M999 Biomechanical lesion, unspecified: Secondary | ICD-10-CM | POA: Diagnosis not present

## 2014-01-24 DIAGNOSIS — M542 Cervicalgia: Secondary | ICD-10-CM | POA: Diagnosis not present

## 2014-01-24 DIAGNOSIS — M9981 Other biomechanical lesions of cervical region: Secondary | ICD-10-CM | POA: Diagnosis not present

## 2014-01-25 ENCOUNTER — Encounter (HOSPITAL_COMMUNITY): Payer: Self-pay

## 2014-01-30 ENCOUNTER — Telehealth (INDEPENDENT_AMBULATORY_CARE_PROVIDER_SITE_OTHER): Payer: Self-pay

## 2014-01-30 NOTE — Telephone Encounter (Signed)
Message copied by Ivor Costa on Mon Jan 30, 2014  3:00 PM ------      Message from: Joya San      Created: Thu Jan 26, 2014 11:38 AM      Contact: [phone]       Pt has some questions. ------

## 2014-01-30 NOTE — Telephone Encounter (Signed)
Return call to patient:  Called and left message for patient to call our office at her convenience.

## 2014-01-31 DIAGNOSIS — M810 Age-related osteoporosis without current pathological fracture: Secondary | ICD-10-CM | POA: Diagnosis not present

## 2014-01-31 DIAGNOSIS — C50919 Malignant neoplasm of unspecified site of unspecified female breast: Secondary | ICD-10-CM | POA: Diagnosis not present

## 2014-02-07 ENCOUNTER — Ambulatory Visit (INDEPENDENT_AMBULATORY_CARE_PROVIDER_SITE_OTHER): Payer: Medicare Other | Admitting: General Surgery

## 2014-02-07 ENCOUNTER — Encounter (INDEPENDENT_AMBULATORY_CARE_PROVIDER_SITE_OTHER): Payer: Self-pay | Admitting: General Surgery

## 2014-02-07 VITALS — BP 136/70 | HR 78 | Temp 97.8°F | Resp 14 | Ht 66.0 in | Wt 137.4 lb

## 2014-02-07 DIAGNOSIS — M545 Low back pain, unspecified: Secondary | ICD-10-CM | POA: Diagnosis not present

## 2014-02-07 DIAGNOSIS — C50419 Malignant neoplasm of upper-outer quadrant of unspecified female breast: Secondary | ICD-10-CM

## 2014-02-07 DIAGNOSIS — M999 Biomechanical lesion, unspecified: Secondary | ICD-10-CM | POA: Diagnosis not present

## 2014-02-07 DIAGNOSIS — M546 Pain in thoracic spine: Secondary | ICD-10-CM | POA: Diagnosis not present

## 2014-02-07 DIAGNOSIS — M542 Cervicalgia: Secondary | ICD-10-CM | POA: Diagnosis not present

## 2014-02-07 DIAGNOSIS — M9981 Other biomechanical lesions of cervical region: Secondary | ICD-10-CM | POA: Diagnosis not present

## 2014-02-07 DIAGNOSIS — IMO0001 Reserved for inherently not codable concepts without codable children: Secondary | ICD-10-CM | POA: Diagnosis not present

## 2014-02-07 NOTE — Progress Notes (Signed)
History: Patient returns for more long-term followup status post left mastectomy with a postop seroma we have been aspirating. She also has some excess skin on the lower flap and posteriorly it makes it difficult for her to wear a prosthesis. We've been monitoring this to see how much resolution of the seroma would help.  Exam: Today I cannot feel any fluid of any significance beneath the flaps. They could be a few cc but not enough to aspirate. She does have redundant skin on the lower flap particularly and posteriorly that continues to bother her wearing a prosthesis.  Assessment and plan: Status post left mastectomy. The seroma has essentially resolved. She does have excess skin I think very likely will require a revision of her wound at some point. I discussed this with her. I will see her back in 2 months for more long-term followup.

## 2014-02-10 DIAGNOSIS — C50919 Malignant neoplasm of unspecified site of unspecified female breast: Secondary | ICD-10-CM | POA: Diagnosis not present

## 2014-02-21 DIAGNOSIS — M546 Pain in thoracic spine: Secondary | ICD-10-CM | POA: Diagnosis not present

## 2014-02-21 DIAGNOSIS — M545 Low back pain, unspecified: Secondary | ICD-10-CM | POA: Diagnosis not present

## 2014-02-21 DIAGNOSIS — M542 Cervicalgia: Secondary | ICD-10-CM | POA: Diagnosis not present

## 2014-02-21 DIAGNOSIS — M9981 Other biomechanical lesions of cervical region: Secondary | ICD-10-CM | POA: Diagnosis not present

## 2014-02-21 DIAGNOSIS — IMO0001 Reserved for inherently not codable concepts without codable children: Secondary | ICD-10-CM | POA: Diagnosis not present

## 2014-02-21 DIAGNOSIS — M999 Biomechanical lesion, unspecified: Secondary | ICD-10-CM | POA: Diagnosis not present

## 2014-02-22 ENCOUNTER — Ambulatory Visit (INDEPENDENT_AMBULATORY_CARE_PROVIDER_SITE_OTHER): Payer: Medicare Other | Admitting: General Surgery

## 2014-02-22 ENCOUNTER — Encounter (INDEPENDENT_AMBULATORY_CARE_PROVIDER_SITE_OTHER): Payer: Self-pay | Admitting: General Surgery

## 2014-02-22 VITALS — BP 130/76 | HR 71 | Temp 97.3°F | Ht 65.0 in | Wt 139.0 lb

## 2014-02-22 DIAGNOSIS — IMO0002 Reserved for concepts with insufficient information to code with codable children: Secondary | ICD-10-CM

## 2014-02-22 MED ORDER — DOXYCYCLINE HYCLATE 100 MG PO TABS: 100.0000 mg | ORAL_TABLET | Freq: Two times a day (BID) | ORAL | Status: DC

## 2014-02-22 NOTE — Progress Notes (Signed)
Chief complaint: Redness and swelling left arm  History: This returns to the office with history of recent left mastectomy and sentinel lymph node as above. The day before yesterday in the evening she noted the fairly sudden onset of swelling in her upper arm and forearm and says there was redness extending up her arm and toward the left axilla as well. Yesterday it was a little bit better and she called the office and we got an appointment for today. Today it is better still B. Redness and swelling of the upper arm and forearm these all but she has persistent area of swelling and tenderness and redness essentially right overlying the oelcrenon process at her left elbow.   Exam: BP 130/76  Pulse 71  Temp(Src) 97.3 F (36.3 C)  Ht 5\' 5"  (1.651 m)  Wt 139 lb (63.05 kg)  BMI 23.13 kg/m2 Extremities: There is an approximately 5 cm area of mild swelling, mild warmth and erythema and tenderness overlying the lateral left elbow. Full range of motion with some discomfort. The fluid palpable in the joint. No generalized arm swelling or erythema. Chest wall and axilla negative.  Assessment and plan: She appears to have a cellulitis of her arm of uncertain origin. With recent breast surgery and lymph node removal and covered with antibiotics is wise. I started her on doxycycline 100 mg twice daily. She'll elevate the arm. She will call for any worsening symptoms. She has an appointment already for next month which she will keep as long as this resolves at the end of the course of her antibiotics.

## 2014-02-22 NOTE — Patient Instructions (Signed)
Call if any worse or has not resolved at the end of your course of antibiotics. Keep the arm elevated as much as possible.

## 2014-02-28 DIAGNOSIS — Z9010 Acquired absence of unspecified breast and nipple: Secondary | ICD-10-CM | POA: Diagnosis not present

## 2014-02-28 DIAGNOSIS — Z853 Personal history of malignant neoplasm of breast: Secondary | ICD-10-CM | POA: Diagnosis not present

## 2014-02-28 DIAGNOSIS — F17200 Nicotine dependence, unspecified, uncomplicated: Secondary | ICD-10-CM | POA: Diagnosis not present

## 2014-03-14 DIAGNOSIS — M9981 Other biomechanical lesions of cervical region: Secondary | ICD-10-CM | POA: Diagnosis not present

## 2014-03-14 DIAGNOSIS — M545 Low back pain, unspecified: Secondary | ICD-10-CM | POA: Diagnosis not present

## 2014-03-14 DIAGNOSIS — M546 Pain in thoracic spine: Secondary | ICD-10-CM | POA: Diagnosis not present

## 2014-03-14 DIAGNOSIS — M542 Cervicalgia: Secondary | ICD-10-CM | POA: Diagnosis not present

## 2014-03-14 DIAGNOSIS — M999 Biomechanical lesion, unspecified: Secondary | ICD-10-CM | POA: Diagnosis not present

## 2014-03-14 DIAGNOSIS — IMO0001 Reserved for inherently not codable concepts without codable children: Secondary | ICD-10-CM | POA: Diagnosis not present

## 2014-04-02 NOTE — Progress Notes (Unsigned)
Patient was seen in follow up today.   I went over her pathology today. We discussed the role of anti-estrogen therapy. She did not feel that she wanted to go on it. And felt that I was pushing medications on her that were not necessary. Patient felt that the staff at the cancer center did not know what they were doing including myself. She did not want anything further.   She declined any further appointments or recommendations. She declined a physical exam.

## 2014-04-04 DIAGNOSIS — M542 Cervicalgia: Secondary | ICD-10-CM | POA: Diagnosis not present

## 2014-04-04 DIAGNOSIS — M546 Pain in thoracic spine: Secondary | ICD-10-CM | POA: Diagnosis not present

## 2014-04-04 DIAGNOSIS — M999 Biomechanical lesion, unspecified: Secondary | ICD-10-CM | POA: Diagnosis not present

## 2014-04-04 DIAGNOSIS — IMO0001 Reserved for inherently not codable concepts without codable children: Secondary | ICD-10-CM | POA: Diagnosis not present

## 2014-04-04 DIAGNOSIS — M545 Low back pain, unspecified: Secondary | ICD-10-CM | POA: Diagnosis not present

## 2014-04-04 DIAGNOSIS — M9981 Other biomechanical lesions of cervical region: Secondary | ICD-10-CM | POA: Diagnosis not present

## 2014-04-12 ENCOUNTER — Ambulatory Visit (INDEPENDENT_AMBULATORY_CARE_PROVIDER_SITE_OTHER): Payer: Medicare Other | Admitting: General Surgery

## 2014-04-12 ENCOUNTER — Encounter (INDEPENDENT_AMBULATORY_CARE_PROVIDER_SITE_OTHER): Payer: Self-pay | Admitting: General Surgery

## 2014-04-12 VITALS — BP 138/76 | HR 77 | Temp 97.4°F | Resp 16 | Wt 138.0 lb

## 2014-04-12 DIAGNOSIS — Z09 Encounter for follow-up examination after completed treatment for conditions other than malignant neoplasm: Secondary | ICD-10-CM

## 2014-04-12 NOTE — Progress Notes (Signed)
Chief complaint: Followup mastectomy and cellulitis of left arm  History: Patient returns to the office now just over 4 months following left total mastectomy. Her last visit she had some cellulitis around her left elbow of uncertain etiology. She was placed on doxycycline. She states this has resolved. She continues to have some difficulty with her prosthesis due to some excess skin at the mastectomy site  Exam: BP 138/76  Pulse 77  Temp(Src) 97.4 F (36.3 C)  Resp 16  Wt 138 lb (62.596 kg) General: Appears well Extremities: No further cellulitis of the arm. Chest wall: No fluid collection. There is redundant skin of the lower flap which gives her a problem with her prosthesis.  Assessment and plan: Cellulitis resolved. Difficulty with the skin flap of excess skin. We discussed this today and I think for her to be comfortable with her prosthesis this will need to be excised. We would like to give [place] a couple more months to heal. I will see her back at that time and she would like to go ahead we can get her scheduled for revision of her mastectomy wound at that time.

## 2014-04-18 DIAGNOSIS — M999 Biomechanical lesion, unspecified: Secondary | ICD-10-CM | POA: Diagnosis not present

## 2014-04-18 DIAGNOSIS — M545 Low back pain, unspecified: Secondary | ICD-10-CM | POA: Diagnosis not present

## 2014-04-18 DIAGNOSIS — M9981 Other biomechanical lesions of cervical region: Secondary | ICD-10-CM | POA: Diagnosis not present

## 2014-04-18 DIAGNOSIS — M546 Pain in thoracic spine: Secondary | ICD-10-CM | POA: Diagnosis not present

## 2014-04-18 DIAGNOSIS — M542 Cervicalgia: Secondary | ICD-10-CM | POA: Diagnosis not present

## 2014-04-18 DIAGNOSIS — IMO0001 Reserved for inherently not codable concepts without codable children: Secondary | ICD-10-CM | POA: Diagnosis not present

## 2014-04-20 DIAGNOSIS — M545 Low back pain, unspecified: Secondary | ICD-10-CM | POA: Diagnosis not present

## 2014-04-20 DIAGNOSIS — M542 Cervicalgia: Secondary | ICD-10-CM | POA: Diagnosis not present

## 2014-04-20 DIAGNOSIS — M546 Pain in thoracic spine: Secondary | ICD-10-CM | POA: Diagnosis not present

## 2014-04-20 DIAGNOSIS — M9981 Other biomechanical lesions of cervical region: Secondary | ICD-10-CM | POA: Diagnosis not present

## 2014-04-20 DIAGNOSIS — IMO0001 Reserved for inherently not codable concepts without codable children: Secondary | ICD-10-CM | POA: Diagnosis not present

## 2014-04-20 DIAGNOSIS — M999 Biomechanical lesion, unspecified: Secondary | ICD-10-CM | POA: Diagnosis not present

## 2014-04-25 DIAGNOSIS — M999 Biomechanical lesion, unspecified: Secondary | ICD-10-CM | POA: Diagnosis not present

## 2014-04-25 DIAGNOSIS — M542 Cervicalgia: Secondary | ICD-10-CM | POA: Diagnosis not present

## 2014-04-25 DIAGNOSIS — M9981 Other biomechanical lesions of cervical region: Secondary | ICD-10-CM | POA: Diagnosis not present

## 2014-04-25 DIAGNOSIS — M546 Pain in thoracic spine: Secondary | ICD-10-CM | POA: Diagnosis not present

## 2014-04-25 DIAGNOSIS — M545 Low back pain, unspecified: Secondary | ICD-10-CM | POA: Diagnosis not present

## 2014-04-25 DIAGNOSIS — IMO0001 Reserved for inherently not codable concepts without codable children: Secondary | ICD-10-CM | POA: Diagnosis not present

## 2014-04-26 ENCOUNTER — Other Ambulatory Visit: Payer: Self-pay | Admitting: Internal Medicine

## 2014-04-26 DIAGNOSIS — C50919 Malignant neoplasm of unspecified site of unspecified female breast: Secondary | ICD-10-CM

## 2014-04-26 DIAGNOSIS — R110 Nausea: Secondary | ICD-10-CM | POA: Diagnosis not present

## 2014-04-26 DIAGNOSIS — R519 Headache, unspecified: Secondary | ICD-10-CM

## 2014-04-26 DIAGNOSIS — M542 Cervicalgia: Secondary | ICD-10-CM | POA: Diagnosis not present

## 2014-04-26 DIAGNOSIS — R51 Headache: Secondary | ICD-10-CM

## 2014-04-26 DIAGNOSIS — R42 Dizziness and giddiness: Secondary | ICD-10-CM | POA: Diagnosis not present

## 2014-04-26 DIAGNOSIS — IMO0002 Reserved for concepts with insufficient information to code with codable children: Secondary | ICD-10-CM | POA: Diagnosis not present

## 2014-04-26 DIAGNOSIS — F329 Major depressive disorder, single episode, unspecified: Secondary | ICD-10-CM | POA: Diagnosis not present

## 2014-04-26 DIAGNOSIS — Z1331 Encounter for screening for depression: Secondary | ICD-10-CM | POA: Diagnosis not present

## 2014-04-26 DIAGNOSIS — F3289 Other specified depressive episodes: Secondary | ICD-10-CM | POA: Diagnosis not present

## 2014-04-27 DIAGNOSIS — M5030 Other cervical disc degeneration, unspecified cervical region: Secondary | ICD-10-CM | POA: Diagnosis not present

## 2014-04-28 ENCOUNTER — Ambulatory Visit
Admission: RE | Admit: 2014-04-28 | Discharge: 2014-04-28 | Disposition: A | Discharge status: Home / Self Care | Payer: Medicare Other | Source: Ambulatory Visit | Attending: Internal Medicine | Admitting: Internal Medicine

## 2014-04-28 DIAGNOSIS — R519 Headache, unspecified: Secondary | ICD-10-CM

## 2014-04-28 DIAGNOSIS — R51 Headache: Secondary | ICD-10-CM

## 2014-04-28 DIAGNOSIS — M542 Cervicalgia: Secondary | ICD-10-CM

## 2014-04-28 DIAGNOSIS — C50919 Malignant neoplasm of unspecified site of unspecified female breast: Secondary | ICD-10-CM

## 2014-04-28 DIAGNOSIS — R42 Dizziness and giddiness: Secondary | ICD-10-CM | POA: Diagnosis not present

## 2014-05-02 ENCOUNTER — Other Ambulatory Visit: Payer: PRIVATE HEALTH INSURANCE

## 2014-05-04 DIAGNOSIS — IMO0001 Reserved for inherently not codable concepts without codable children: Secondary | ICD-10-CM | POA: Diagnosis not present

## 2014-05-04 DIAGNOSIS — M9981 Other biomechanical lesions of cervical region: Secondary | ICD-10-CM | POA: Diagnosis not present

## 2014-05-04 DIAGNOSIS — M5030 Other cervical disc degeneration, unspecified cervical region: Secondary | ICD-10-CM | POA: Diagnosis not present

## 2014-05-04 DIAGNOSIS — M999 Biomechanical lesion, unspecified: Secondary | ICD-10-CM | POA: Diagnosis not present

## 2014-05-04 DIAGNOSIS — M47812 Spondylosis without myelopathy or radiculopathy, cervical region: Secondary | ICD-10-CM | POA: Diagnosis not present

## 2014-05-04 DIAGNOSIS — M542 Cervicalgia: Secondary | ICD-10-CM | POA: Diagnosis not present

## 2014-05-04 DIAGNOSIS — M546 Pain in thoracic spine: Secondary | ICD-10-CM | POA: Diagnosis not present

## 2014-05-09 ENCOUNTER — Ambulatory Visit (INDEPENDENT_AMBULATORY_CARE_PROVIDER_SITE_OTHER): Payer: Medicare Other | Admitting: Neurology

## 2014-05-09 ENCOUNTER — Encounter: Payer: Self-pay | Admitting: Neurology

## 2014-05-09 ENCOUNTER — Ambulatory Visit: Payer: Medicare Other | Admitting: Neurology

## 2014-05-09 VITALS — Ht 66.0 in | Wt 136.8 lb

## 2014-05-09 DIAGNOSIS — M9981 Other biomechanical lesions of cervical region: Secondary | ICD-10-CM | POA: Diagnosis not present

## 2014-05-09 DIAGNOSIS — M999 Biomechanical lesion, unspecified: Secondary | ICD-10-CM | POA: Diagnosis not present

## 2014-05-09 DIAGNOSIS — M47812 Spondylosis without myelopathy or radiculopathy, cervical region: Secondary | ICD-10-CM | POA: Diagnosis not present

## 2014-05-09 DIAGNOSIS — M5030 Other cervical disc degeneration, unspecified cervical region: Secondary | ICD-10-CM | POA: Diagnosis not present

## 2014-05-09 DIAGNOSIS — F17200 Nicotine dependence, unspecified, uncomplicated: Secondary | ICD-10-CM | POA: Diagnosis not present

## 2014-05-09 DIAGNOSIS — IMO0001 Reserved for inherently not codable concepts without codable children: Secondary | ICD-10-CM | POA: Diagnosis not present

## 2014-05-09 DIAGNOSIS — R42 Dizziness and giddiness: Secondary | ICD-10-CM | POA: Diagnosis not present

## 2014-05-09 DIAGNOSIS — I6509 Occlusion and stenosis of unspecified vertebral artery: Secondary | ICD-10-CM | POA: Diagnosis not present

## 2014-05-09 DIAGNOSIS — M542 Cervicalgia: Secondary | ICD-10-CM | POA: Diagnosis not present

## 2014-05-09 DIAGNOSIS — M546 Pain in thoracic spine: Secondary | ICD-10-CM | POA: Diagnosis not present

## 2014-05-09 NOTE — Patient Instructions (Signed)
-   try to minimize your stress level - schedule carotid doppler - blood draw for stroke risk factor evaluation - recommend vestibular PT for balance training. - tylenol for tension headache

## 2014-05-09 NOTE — Progress Notes (Signed)
NEUROLOGY CLINIC NEW PATIENT NOTE  NAME: Crystal Hill DOB: 24-Aug-1941  I saw Crystal Hill as a new patient in the neurovascular clinic today regarding  Chief Complaint  Patient presents with  . Headache    NP#1  . Dizziness  .  HPI: Crystal Hill is a 73 y.o. female with PMH of depression, anxiety, and left breast cancer s/p mastectomy who presents as a new patient for dizziness.   She stated that it has been a while that she would have woozy feelings when she changes her positions quickly. For example, she was sitting fine, if get up to quick, she may have the feeling. Or if she changes direction too quick, or look up suddenly, she would feel woozy or lightheadedness. Once, she looked up and the next thing was she found out she had falling backward. Last Friday, she felt so good and she worked in her garden and she bending down, and getting up, which made her woozy and nauseous. 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 She sometimes has nausea feeling not necessary associated with woozy and lightheadedness. The duration of wooziness varies depending on what activity she is doing. No weakness or numbness, no LOC, no seizure.   She also has frequent tightness in the head, not headache but feels like a band or cap at the top of head, sometimes could go down to occipital area or temporal region, if severe, could be nauseous too.   She has neck pain for a while and followed up with PCP and chiropractor.   She has hx of depression and anxiety. This started with many years ago she is trying to help a friend but the friend refused her help and suffered very much. She had psycho trauma from that experience, and developed depression and anxiety. She has a lot of stress in her life, she has to taking care of her mom, who is in her late 8s. Also she has friends to help and they  are non-compliant to her suggestions. She lost her husband 40 years ago. She had two sons, one in [place] as anesthesiologist and the other son lives locally.  This Crystal Hill she was diagnosed with left breast cancer, she had mastectomy without chemo or radio. Pathology confirmed the ductal invasive carcinoma. Her estrogen therapy was stopped due to breast cancer. She had a lot of night sweats and flushing symptoms, and mood swing.   She denies any HTN, DM, HLD. She is current smoker, 2PPD for many years, not ready to quit. Denies alcohol or illicit drugs.  Past Medical History  Diagnosis Date  . Pulmonary nodule   . Chronic sinusitis   . History of recurrent pneumonia   . Tobacco abuse   . Nephrolithiasis   . Breast cancer     left  . PONV (postoperative nausea and vomiting)   . Pneumonia     hx  . Anxiety    Past Surgical History  Procedure Laterality Date  . Partial hysterectomy    . Tubal ligation    . Joint replacement      shoulder x 3 recon  . Simple mastectomy with axillary sentinel node biopsy Left 11/22/2013    Procedure: SIMPLE MASTECTOMY WITH AXILLARY SENTINEL NODE BIOPSY;  Surgeon: Edward Jolly, MD;  Location: [facility];  Service: General;  Laterality: Left;  .  Breast surgery     Family History  Problem Relation Age of Onset  . COPD Mother   . Lung cancer Father 53    deceased 10  . Breast cancer Maternal Aunt 90    living in her 75s  . Cancer Paternal Aunt 51    not sure; maybe breast; died at 64  . Prostate cancer Maternal Grandfather     died at 73   Current Outpatient Prescriptions  Medication Sig Dispense Refill  . Azelaic Acid (FINACEA) 15 % cream Apply 1 application topically daily. As directed      . azelastine (ASTELIN) 137 MCG/SPRAY nasal spray Place 1 spray into the nose daily as needed. Use in each nostril as directed      . cholecalciferol (VITAMIN D) 1000 UNITS tablet Take 1,000 Units by mouth daily. 2 tablets daily      . Clemastine Fumarate  1.34 MG TABS Take by mouth as needed.      . Docosahexaenoic Acid (DHA COMPLETE PO) Take 2 capsules by mouth 2 (two) times daily.       . Lactobacillus (ACIDOPHILUS) CAPS Take 1 capsule by mouth daily. Once a day       . Lysine 1000 MG TABS Take 1,000 mg by mouth daily. Once a day      . mometasone (NASONEX) 50 MCG/ACT nasal spray Place 2 sprays into the nose daily as needed.       . Multiple Vitamin (MULTIVITAMIN) tablet Take 2 tablets by mouth daily.       . pseudoephedrine (SUDAFED) 30 MG tablet Take 30 mg by mouth every 4 (four) hours as needed for congestion.      Marland Kitchen UNABLE TO FIND Rx: [id number] Post mastectomy camisole (Quantity: 2) Dx: 174.9; Bilateral Matectomy  1 each  0  . vitamin C (ASCORBIC ACID) 500 MG tablet Take 500 mg by mouth daily.        . zaleplon (SONATA) 10 MG capsule Take 10 mg by mouth at bedtime.         No current facility-administered medications for this visit.   Allergies  Allergen Reactions  . Codeine Palpitations    Heart palpitations "like I'm having a heart attack"  . Penicillins Itching  . Sulfonamide Derivatives Other (See Comments)    Blisters from mouth all the way down GI tract.   History   Social History  . Marital Status: Widowed    Spouse Name: N/A    Number of Children: 2  . Years of Education: 12th   Occupational History  . retired    Social History Main Topics  . Smoking status: Current Every Day Smoker -- 2.00 packs/day for 55 years    Types: Cigarettes  . Smokeless tobacco: Never Used  . Alcohol Use: Yes     Comment: occ  . Drug Use: No  . Sexual Activity: Not Currently     Comment: menarche age 66, P85, first live birth age 89, menopause 23, HRT x 35 yrs   Other Topics Concern  . Not on file   Social History Narrative   Patient live at home alone   Patient right handed   Patient drinks coffee, Soda    Review of Systems Full 14 system review of systems performed and notable only for those listed, all others are neg:    Constitutional: weight loss, fatigue  Cardiovascular: N/A  Ear/Nose/Throat: N/A  Skin: N/A  Eyes: N/A  Respiratory: N/A  Gastroitestinal: N/A  Hematology/Lymphatic: N/A  Endocrine: flushing  Musculoskeletal: N/A  Allergy/Immunology: N/A  Neurological: head tightness, dizziness  Psychiatric: insomnia   Physical Exam  BP lying 143/68 - 70, sitting 164/69 - 64, standing 157/76 - 69  General - Well nourished, well developed, in no apparent distress.  Ophthalmologic - Sharp disc margins OU.  Cardiovascular - Regular rate and rhythm with no murmur. Carotid pulses were 2+ without bruits .   Neck - supple, no nuchal rigidity .  Mental Status -  Level of arousal and orientation to time, place, and person were intact. Language including expression, naming, repetition, comprehension, reading, and writing was assessed and found intact. Attention span and concentration were normal. Recent and remote memory were intact. Fund of Knowledge was assessed and was intact.  Cranial Nerves II - XII - II - Visual field intact OU. III, IV, VI - Extraocular movements intact. V - Facial sensation intact bilaterally. VII - Facial movement intact bilaterally. VIII - Hearing & vestibular intact bilaterally. X - Palate elevates symmetrically. XI - Chin turning & shoulder shrug intact bilaterally. XII - Tongue protrusion intact.  Motor Strength - The patient's strength was normal in all extremities and pronator drift was absent.  Bulk was normal and fasciculations were absent.   Motor Tone - Muscle tone was assessed at the neck and appendages and was normal.  Reflexes - The patient's reflexes were normal in all extremities and she had no pathological reflexes.  Sensory - Light touch, temperature/pinprick, vibration and proprioception, and Romberg testing were assessed and were normal.    Coordination - The patient had normal movements in the hands and feet with no ataxia or dysmetria.  Tremor  was absent.  Gait and Station - The patient's transfers, posture, gait, station, and turns were observed as normal.   Imaging MRI brain - 04/28/14 1. No acute intracranial abnormalities. Unremarkable appearance of the brain for age 58. No major intracranial arterial occlusion or stenosis 3. The distal right vertebral A is small in caliber and occludes at the level of the distal V3 segment. Patent and dominant left VA supplies the basilar.  Lab Review none  Assessments: NIHSS: 0 Rankin: 1   Assessment and Plan:   In summary, Crystal Hill is a 73 y.o. female with PMH of depression, anxiety and left breast cancer s/p mastectom presents with complains of dizziness. By description, it is woozy lightheadedness, not vertigo, with quick movement or change position of the head. Also she has typical tension headache. Neuro exam negative. 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 She needs to adjust her movement and act slowly in change position or make movement to avoid symptoms. She will benefit from PT for vestibular training. Her symptoms most likely associated with her anxiety and stress. She was recommend to take tylenol for the tension headache. I have offered clonazepam for anxiety but she does not want to take too many medications.  Pt does not have much stroke risk factor except chronic smoker. She is not ready to quit smoking yet. Will check stroke labs and do carotid doppler for stroke prevention.   - carotid doppler - stroke labs - PT referrral for vestibular training - minimize stress and tylenol for tension headache - quit smoking recommended but she said that she is not ready - RTC in 6 weeks.  Thank you very much for the opportunity to participate in the care of this patient.  Please do not hesitate to call if any  questions or concerns arise.  Orders Placed This Encounter  Procedures  . US Carotid Bilateral    Standing  Status: Future     Number of Occurrences:      Standing Expiration Date: 07/10/2015    Order Specific Question:  Reason for Exam (SYMPTOM  OR DIAGNOSIS REQUIRED)    Answer:  dizziness    Order Specific Question:  Preferred imaging location?    Answer:  Internal  . TSH + free T4  . Basic metabolic panel  . Lipid panel  . Ambulatory referral to Physical Therapy    Referral Priority:  Routine    Referral Type:  Physical Medicine    Referral Reason:  Specialty Services Required    Requested Specialty:  Physical Therapy    Number of Visits Requested:  1    Patient Instructions  - try to minimize your stress level - schedule carotid doppler - blood draw for stroke risk factor evaluation - recommend vestibular PT for balance training. - tylenol for tension headache    Rosalin Hawking, MD PhD [facility] [address] [place], [place] [address] [phone]

## 2014-05-11 ENCOUNTER — Ambulatory Visit: Payer: Medicare Other | Admitting: Neurology

## 2014-05-15 DIAGNOSIS — Z0000 Encounter for general adult medical examination without abnormal findings: Secondary | ICD-10-CM | POA: Diagnosis not present

## 2014-05-15 DIAGNOSIS — R42 Dizziness and giddiness: Secondary | ICD-10-CM | POA: Diagnosis not present

## 2014-05-15 DIAGNOSIS — E785 Hyperlipidemia, unspecified: Secondary | ICD-10-CM | POA: Diagnosis not present

## 2014-05-16 DIAGNOSIS — M9981 Other biomechanical lesions of cervical region: Secondary | ICD-10-CM | POA: Diagnosis not present

## 2014-05-16 DIAGNOSIS — M546 Pain in thoracic spine: Secondary | ICD-10-CM | POA: Diagnosis not present

## 2014-05-16 DIAGNOSIS — M47812 Spondylosis without myelopathy or radiculopathy, cervical region: Secondary | ICD-10-CM | POA: Diagnosis not present

## 2014-05-16 DIAGNOSIS — M999 Biomechanical lesion, unspecified: Secondary | ICD-10-CM | POA: Diagnosis not present

## 2014-05-16 DIAGNOSIS — IMO0001 Reserved for inherently not codable concepts without codable children: Secondary | ICD-10-CM | POA: Diagnosis not present

## 2014-05-16 DIAGNOSIS — M542 Cervicalgia: Secondary | ICD-10-CM | POA: Diagnosis not present

## 2014-05-16 DIAGNOSIS — M5030 Other cervical disc degeneration, unspecified cervical region: Secondary | ICD-10-CM | POA: Diagnosis not present

## 2014-05-17 DIAGNOSIS — Z049 Encounter for examination and observation for unspecified reason: Secondary | ICD-10-CM | POA: Diagnosis not present

## 2014-05-17 DIAGNOSIS — Z79899 Other long term (current) drug therapy: Secondary | ICD-10-CM | POA: Diagnosis not present

## 2014-05-17 DIAGNOSIS — IMO0002 Reserved for concepts with insufficient information to code with codable children: Secondary | ICD-10-CM | POA: Diagnosis not present

## 2014-05-17 DIAGNOSIS — R51 Headache: Secondary | ICD-10-CM | POA: Diagnosis not present

## 2014-05-17 DIAGNOSIS — F17200 Nicotine dependence, unspecified, uncomplicated: Secondary | ICD-10-CM | POA: Diagnosis not present

## 2014-05-17 DIAGNOSIS — E785 Hyperlipidemia, unspecified: Secondary | ICD-10-CM | POA: Diagnosis not present

## 2014-05-17 DIAGNOSIS — M542 Cervicalgia: Secondary | ICD-10-CM | POA: Diagnosis not present

## 2014-05-17 DIAGNOSIS — R42 Dizziness and giddiness: Secondary | ICD-10-CM | POA: Diagnosis not present

## 2014-05-18 DIAGNOSIS — L570 Actinic keratosis: Secondary | ICD-10-CM | POA: Diagnosis not present

## 2014-05-18 DIAGNOSIS — L821 Other seborrheic keratosis: Secondary | ICD-10-CM | POA: Diagnosis not present

## 2014-05-18 DIAGNOSIS — D239 Other benign neoplasm of skin, unspecified: Secondary | ICD-10-CM | POA: Diagnosis not present

## 2014-05-18 DIAGNOSIS — L719 Rosacea, unspecified: Secondary | ICD-10-CM | POA: Diagnosis not present

## 2014-05-19 ENCOUNTER — Encounter (INDEPENDENT_AMBULATORY_CARE_PROVIDER_SITE_OTHER): Payer: Medicare Other | Admitting: General Surgery

## 2014-05-23 DIAGNOSIS — R61 Generalized hyperhidrosis: Secondary | ICD-10-CM | POA: Diagnosis not present

## 2014-05-23 DIAGNOSIS — N951 Menopausal and female climacteric states: Secondary | ICD-10-CM | POA: Diagnosis not present

## 2014-05-23 DIAGNOSIS — R609 Edema, unspecified: Secondary | ICD-10-CM | POA: Diagnosis not present

## 2014-05-23 DIAGNOSIS — Z853 Personal history of malignant neoplasm of breast: Secondary | ICD-10-CM | POA: Diagnosis not present

## 2014-05-23 DIAGNOSIS — M2550 Pain in unspecified joint: Secondary | ICD-10-CM | POA: Diagnosis not present

## 2014-05-24 DIAGNOSIS — M5030 Other cervical disc degeneration, unspecified cervical region: Secondary | ICD-10-CM | POA: Diagnosis not present

## 2014-05-24 DIAGNOSIS — M999 Biomechanical lesion, unspecified: Secondary | ICD-10-CM | POA: Diagnosis not present

## 2014-05-24 DIAGNOSIS — IMO0001 Reserved for inherently not codable concepts without codable children: Secondary | ICD-10-CM | POA: Diagnosis not present

## 2014-05-24 DIAGNOSIS — M546 Pain in thoracic spine: Secondary | ICD-10-CM | POA: Diagnosis not present

## 2014-05-24 DIAGNOSIS — M47812 Spondylosis without myelopathy or radiculopathy, cervical region: Secondary | ICD-10-CM | POA: Diagnosis not present

## 2014-05-24 DIAGNOSIS — M9981 Other biomechanical lesions of cervical region: Secondary | ICD-10-CM | POA: Diagnosis not present

## 2014-05-24 DIAGNOSIS — M542 Cervicalgia: Secondary | ICD-10-CM | POA: Diagnosis not present

## 2014-06-01 ENCOUNTER — Other Ambulatory Visit: Payer: Medicare Other

## 2014-06-06 DIAGNOSIS — M6530 Trigger finger, unspecified finger: Secondary | ICD-10-CM | POA: Diagnosis not present

## 2014-06-08 DIAGNOSIS — M546 Pain in thoracic spine: Secondary | ICD-10-CM | POA: Diagnosis not present

## 2014-06-08 DIAGNOSIS — M47812 Spondylosis without myelopathy or radiculopathy, cervical region: Secondary | ICD-10-CM | POA: Diagnosis not present

## 2014-06-08 DIAGNOSIS — M9981 Other biomechanical lesions of cervical region: Secondary | ICD-10-CM | POA: Diagnosis not present

## 2014-06-08 DIAGNOSIS — M5030 Other cervical disc degeneration, unspecified cervical region: Secondary | ICD-10-CM | POA: Diagnosis not present

## 2014-06-08 DIAGNOSIS — IMO0001 Reserved for inherently not codable concepts without codable children: Secondary | ICD-10-CM | POA: Diagnosis not present

## 2014-06-08 DIAGNOSIS — M999 Biomechanical lesion, unspecified: Secondary | ICD-10-CM | POA: Diagnosis not present

## 2014-06-08 DIAGNOSIS — M542 Cervicalgia: Secondary | ICD-10-CM | POA: Diagnosis not present

## 2014-06-15 DIAGNOSIS — M9905 Segmental and somatic dysfunction of pelvic region: Secondary | ICD-10-CM | POA: Diagnosis not present

## 2014-06-15 DIAGNOSIS — M9902 Segmental and somatic dysfunction of thoracic region: Secondary | ICD-10-CM | POA: Diagnosis not present

## 2014-06-15 DIAGNOSIS — M9901 Segmental and somatic dysfunction of cervical region: Secondary | ICD-10-CM | POA: Diagnosis not present

## 2014-06-15 DIAGNOSIS — M5032 Other cervical disc degeneration, mid-cervical region: Secondary | ICD-10-CM | POA: Diagnosis not present

## 2014-06-15 DIAGNOSIS — M542 Cervicalgia: Secondary | ICD-10-CM | POA: Diagnosis not present

## 2014-06-15 DIAGNOSIS — M47812 Spondylosis without myelopathy or radiculopathy, cervical region: Secondary | ICD-10-CM | POA: Diagnosis not present

## 2014-06-15 DIAGNOSIS — M546 Pain in thoracic spine: Secondary | ICD-10-CM | POA: Diagnosis not present

## 2014-06-15 DIAGNOSIS — M9903 Segmental and somatic dysfunction of lumbar region: Secondary | ICD-10-CM | POA: Diagnosis not present

## 2014-06-20 ENCOUNTER — Ambulatory Visit: Payer: Medicare Other | Admitting: Neurology

## 2014-06-21 ENCOUNTER — Ambulatory Visit (INDEPENDENT_AMBULATORY_CARE_PROVIDER_SITE_OTHER): Payer: Medicare Other

## 2014-06-21 DIAGNOSIS — R42 Dizziness and giddiness: Secondary | ICD-10-CM

## 2014-06-28 DIAGNOSIS — M65342 Trigger finger, left ring finger: Secondary | ICD-10-CM | POA: Diagnosis not present

## 2014-06-28 DIAGNOSIS — M65311 Trigger thumb, right thumb: Secondary | ICD-10-CM | POA: Diagnosis not present

## 2014-06-29 DIAGNOSIS — M9903 Segmental and somatic dysfunction of lumbar region: Secondary | ICD-10-CM | POA: Diagnosis not present

## 2014-06-29 DIAGNOSIS — M542 Cervicalgia: Secondary | ICD-10-CM | POA: Diagnosis not present

## 2014-06-29 DIAGNOSIS — M5032 Other cervical disc degeneration, mid-cervical region: Secondary | ICD-10-CM | POA: Diagnosis not present

## 2014-06-29 DIAGNOSIS — M9902 Segmental and somatic dysfunction of thoracic region: Secondary | ICD-10-CM | POA: Diagnosis not present

## 2014-06-29 DIAGNOSIS — M546 Pain in thoracic spine: Secondary | ICD-10-CM | POA: Diagnosis not present

## 2014-06-29 DIAGNOSIS — M47812 Spondylosis without myelopathy or radiculopathy, cervical region: Secondary | ICD-10-CM | POA: Diagnosis not present

## 2014-06-29 DIAGNOSIS — M9905 Segmental and somatic dysfunction of pelvic region: Secondary | ICD-10-CM | POA: Diagnosis not present

## 2014-06-29 DIAGNOSIS — M9901 Segmental and somatic dysfunction of cervical region: Secondary | ICD-10-CM | POA: Diagnosis not present

## 2014-07-03 ENCOUNTER — Encounter: Payer: Self-pay | Admitting: Neurology

## 2014-07-03 ENCOUNTER — Ambulatory Visit (INDEPENDENT_AMBULATORY_CARE_PROVIDER_SITE_OTHER): Payer: Medicare Other | Admitting: Neurology

## 2014-07-03 VITALS — BP 135/60 | HR 61 | Ht 66.0 in | Wt 136.0 lb

## 2014-07-03 DIAGNOSIS — F17200 Nicotine dependence, unspecified, uncomplicated: Secondary | ICD-10-CM

## 2014-07-03 DIAGNOSIS — I6509 Occlusion and stenosis of unspecified vertebral artery: Secondary | ICD-10-CM | POA: Diagnosis not present

## 2014-07-03 DIAGNOSIS — R42 Dizziness and giddiness: Secondary | ICD-10-CM

## 2014-07-03 DIAGNOSIS — Z720 Tobacco use: Secondary | ICD-10-CM

## 2014-07-03 NOTE — Progress Notes (Signed)
STROKE NEUROLOGY FOLLOW UP NOTE  NAME: DANTE COOTER DOB: Jun 07, 1941  REASON FOR VISIT: stroke follow up HISTORY FROM: pt  Today we had the pleasure of seeing DEMIRA GWYNNE in follow-up at our Neurology Clinic. Pt was accompanied by no one.   History Summary ISYSS ESPINAL is a 73 y.o. female with PMH of depression, anxiety and left breast cancer s/p mastectom presents with complains of dizziness. It was described as woozy, lightheadedness, not vertigo, with quick movement or change position of the head. The duration of wooziness varies depending on what activity she is doing. No weakness or numbness, no LOC, no seizure. Also she has typical tension headache. She has hx of depression and anxiety. She is current smoker, 2PPD for many years, not ready to quit.  This Catalina [place] she was diagnosed with left breast cancer, she had mastectomy without chemo or radio. Pathology confirmed the ductal invasive carcinoma. Her estrogen therapy was stopped due to breast cancer. She had a lot of night sweats and flushing symptoms, and mood swing.  Interval History During the interval time, the patient has been doing fair. Her dizziness was better until 3 weeks ago, she had wooziness for 3 days, even sitting in chair or in front of computer, not moving, she still felt woozy. She can not think of any stress or specific triggers. She has no symptoms afterwards. However, she did admit that her mom passed away last month, which caused her a lot of stress. Her son in [place] is going to a trip to [place] into mountains, she also worried about him too.  She had carotid doppler early this month, which was unremarkable. Her stroke labs were not drawn last time but her PCP has drawn her blood as annual check up. She refused balance training from PT. She does not think she needs it.   REVIEW OF SYSTEMS: Full 14 system review of systems performed and notable only for those listed below and in HPI above, all others are  negative:  Constitutional: N/A  Cardiovascular: N/A  Ear/Nose/Throat: N/A  Skin: N/A  Eyes: N/A  Respiratory: N/A  Gastroitestinal: N/A  Genitourinary: N/A Hematology/Lymphatic: N/A  Endocrine: N/A  Musculoskeletal: N/A  Allergy/Immunology: N/A  Neurological: dizziness  Psychiatric: N/A  The following represents the patient's updated allergies and side effects list: Allergies  Allergen Reactions  . Codeine Palpitations    Heart palpitations "like I'm having a heart attack"  . Penicillins Itching  . Sulfonamide Derivatives Other (See Comments)    Blisters from mouth all the way down GI tract.    Labs since last visit of relevance include the following: Results for orders placed during the hospital encounter of 11/22/13  CBC      Result Value Ref Range   WBC 7.0  4.0 - 10.5 K/uL   RBC 4.01  3.87 - 5.11 MIL/uL   Hemoglobin 13.2  12.0 - 15.0 g/dL   HCT 38.3  36.0 - 46.0 %   MCV 95.5  78.0 - 100.0 fL   MCH 32.9  26.0 - 34.0 pg   MCHC 34.5  30.0 - 36.0 g/dL   RDW 14.0  11.5 - 15.5 %   Platelets 142 (*) 150 - 400 K/uL    The neurologically relevant items on the patient's problem list were reviewed on today's visit.  Neurologic Examination  Blood pressure 135/60, pulse 61, height 5\' 6"  (1.676 m), weight 136 lb (61.689 kg).  General - Well nourished, well developed, in no  apparent distress.  Ophthalmologic - Sharp disc margins OU.  Cardiovascular - Regular rate and rhythm with no murmur. Carotid pulses were 2+ without bruits .  Neck - supple, no nuchal rigidity .  Mental Status -  Level of arousal and orientation to time, place, and person were intact.  Language including expression, naming, repetition, comprehension, reading, and writing was assessed and found intact.  Attention span and concentration were normal.  Recent and remote memory were intact.  Fund of Knowledge was assessed and was intact.  Cranial Nerves II - XII -  II - Visual field intact OU.  III, IV,  VI - Extraocular movements intact.  V - Facial sensation intact bilaterally.  VII - Facial movement intact bilaterally.  VIII - Hearing & vestibular intact bilaterally.  X - Palate elevates symmetrically.  XI - Chin turning & shoulder shrug intact bilaterally.  XII - Tongue protrusion intact.  Motor Strength - The patient's strength was normal in all extremities and pronator drift was absent. Bulk was normal and fasciculations were absent.  Motor Tone - Muscle tone was assessed at the neck and appendages and was normal.  Reflexes - The patient's reflexes were normal in all extremities and she had no pathological reflexes.  Sensory - Light touch, temperature/pinprick, vibration and proprioception, and Romberg testing were assessed and were normal.  Coordination - The patient had normal movements in the hands and feet with no ataxia or dysmetria. Tremor was absent.  Gait and Station - The patient's transfers, posture, gait, station, and turns were observed as normal.    Data reviewed: I personally reviewed the images and agree with the radiology interpretations.  MRI brain - 04/28/14  1. No acute intracranial abnormalities. Unremarkable appearance of the brain for age  31. No major intracranial arterial occlusion or stenosis  3. The distal right vertebral A is small in caliber and occludes at the level of the distal V3 segment. Patent and dominant left VA supplies the basilar  Carotid doppler 06/18/14 Bilateral: 1-39% ICA stenosis. Vertebral artery flow is antegrade.  Assessment: As you may recall, she is a 73 y.o. Caucasian female with PMH of depression, anxiety and left breast cancer s/p mastectom presents with complains of dizziness. By description, it is woozy lightheadedness, not vertigo, with quick movement or change position of the head. Also she has typical tension headache. Neuro exam negative. 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  CUS negative. She needs to adjust her movement and act slowly in change position or make movement to avoid symptoms. She declined PT for vestibular training. Her symptoms also likely associated with her anxiety and stress. Pt does not have much stroke risk factor except chronic smoker. She is not ready to quit smoking yet. She will follow up with PCP for stroke risk factor modification.      Plan:  - Please follow up with your primary care physician for stroke risk factor modification. - minimize stress level and learn to cope with stress - quit smoking again encouraged - pt refused balance PT, instructed pt to slow down during position change - RTC PRN   No orders of the defined types were placed in this encounter.    Patient Instructions  - carotid doppler was normal - you prefer not to have PT at this time - you had lab draw at your PCP office - minimize stress - quit smoking is recommended - follow up with PCP for regularly check  and stroke risk factor modification - follow up PRN   Rosalin Hawking, MD PhD [facility] [address] [place], [place] [address] [phone]

## 2014-07-03 NOTE — Patient Instructions (Signed)
-   carotid doppler was normal - you prefer not to have PT at this time - you had lab draw at your PCP office - minimize stress - quit smoking is recommended - follow up with PCP for regularly check and stroke risk factor modification - follow up PRN

## 2014-07-13 DIAGNOSIS — M9901 Segmental and somatic dysfunction of cervical region: Secondary | ICD-10-CM | POA: Diagnosis not present

## 2014-07-13 DIAGNOSIS — M9902 Segmental and somatic dysfunction of thoracic region: Secondary | ICD-10-CM | POA: Diagnosis not present

## 2014-07-13 DIAGNOSIS — M5032 Other cervical disc degeneration, mid-cervical region: Secondary | ICD-10-CM | POA: Diagnosis not present

## 2014-07-13 DIAGNOSIS — M9905 Segmental and somatic dysfunction of pelvic region: Secondary | ICD-10-CM | POA: Diagnosis not present

## 2014-07-13 DIAGNOSIS — M542 Cervicalgia: Secondary | ICD-10-CM | POA: Diagnosis not present

## 2014-07-13 DIAGNOSIS — M9903 Segmental and somatic dysfunction of lumbar region: Secondary | ICD-10-CM | POA: Diagnosis not present

## 2014-07-13 DIAGNOSIS — M47812 Spondylosis without myelopathy or radiculopathy, cervical region: Secondary | ICD-10-CM | POA: Diagnosis not present

## 2014-07-13 DIAGNOSIS — M546 Pain in thoracic spine: Secondary | ICD-10-CM | POA: Diagnosis not present

## 2014-07-20 ENCOUNTER — Ambulatory Visit: Payer: Medicare Other | Admitting: Internal Medicine

## 2014-07-20 ENCOUNTER — Encounter: Payer: Self-pay | Admitting: Internal Medicine

## 2014-07-20 VITALS — BP 128/64 | HR 68 | Ht 66.5 in | Wt 138.6 lb

## 2014-07-20 DIAGNOSIS — J40 Bronchitis, not specified as acute or chronic: Secondary | ICD-10-CM

## 2014-07-20 NOTE — Progress Notes (Signed)
06/24/11- 73 year old female active smoker followed for lung nodule, bronchitis, tobacco use. Last here- 06/25/2010. She is still smoking and not interested in trying to quit. She declines flu vaccine. She did not want her blood pressure checked but finally agreed to let me check it as long as she could continue to wear her sweater over her arm. She asks refills of her routine meds which were reviewed. Takes occasional Sudafed for nasal congestion. Would like to have Biaxin available. Has a rescue inhaler she uses only rarely. Denies wheeze chest tightness routine cough or phlegm bloody or purulent sputum chest pain palpitations or swollen glands. Pending varicose vein surgery.  06/22/12- 73 year old female active smoker followed for lung nodule, bronchitis, tobacco use. Last here- 06/25/2010. 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 Her continued smoking is likely to irritate her cords as well-discussed with her. She took prednisone and antibiotic for a summer cold. Continue Sudafed and antihistamines each morning and one half antihistamine( clemastine) at bedtime  CXR 06/26/11- reviewed with her IMPRESSION:  COPD. No active cardiopulmonary disease.  Original Report Authenticated By: Raelyn Number, M.D.   07/19/13- 73 year old female active smoker followed for lung nodule, bronchitis, tobacco use. FOLLOWS FOR: had 4 month long sore throat-went to ENT and was told  she had strained vocal cords and to rest voice-this worked. Otherwise no SOB, wheezing, cough, or congestion. She declines flu vaccine and has made no effort to stop smoking No acute respiratory events in the past year. Denies reflux. Has held a prednisone taper prescription but not filled. Dieted off 20 pounds for her son's wedding  (anesthesiologist in [place]) She did not get the chest x-ray we planned after last visit  07/20/14-  73 year old female active smoker followed for lung nodule, bronchitis, tobacco use. FOLLOWS FOR:not hoarse,Pr. headaches,dizziness,no cough or sob   CT 10/21/2013 16:10 IMPRESSION: 1. Liver is mildly enlarged and appears mildly cirrhotic. Focal hyper attenuating lesion in the posterior right hepatic lobe is incompletely characterized and difficult to definitively confirm as being present on the prior exam. While a flash fill hemangioma or perfusion anomaly can have this appearance, further evaluation with MR abdomen without and with contrast should be considered for more definitive characterization. 2. Otherwise, no evidence of metastatic disease. 3. Three-vessel coronary artery calcification. 4. Cholelithiasis. Electronically Signed  By: Lorin Picket M.D.  On: 10/21/2013 16:10  ROS-see HPI Constitutional:   No-   weight loss, night sweats, fevers, chills, fatigue, lassitude. HEENT:   No-  headaches, difficulty swallowing, tooth/dental problems, sore throat,       No-  sneezing, itching, ear ache, +nasal congestion, post nasal drip,  CV:  No-   chest pain, orthopnea, PND, swelling in lower extremities, anasarca, dizziness, palpitations Resp: No-   shortness of breath with exertion or at rest.              No-   productive cough,  No non-productive cough,  No-  coughing up of blood.              No-   change in color of mucus.  No- wheezing.   Skin: No-   rash or lesions. GI:  No-   heartburn, indigestion, abdominal pain, nausea, vomiting,  GU: MS:  No-   joint pain or swelling.   Neuro- nothing  unusual Psych:  No- change in mood or affect. No depression or anxiety.  No memory loss.   OBJ  General- Alert, Oriented, Affect-appropriate, Distress- none acute  She is trim, Talkative Skin- rash-none, lesions- none, excoriation- none Lymphadenopathy- none Head- atraumatic             Eyes- Gross vision intact, PERRLA, conjunctivae clear secretions            Ears- Hearing, canals-normal            Nose- Clear, no-Septal dev, mucus, polyps, erosion, perforation             Throat- Mallampati II-III , mucosa clear , drainage- none, tonsils- atrophic Neck- flexible , trachea midline, no stridor , thyroid nl, carotid no bruit Chest - symmetrical excursion , unlabored           Heart/CV- RRR , no murmur , no gallop  , no rub, nl s1 s2                           - JVD- none , edema- none, stasis changes- none, varices- none           Lung- clear to P&A, wheeze- none, cough- none , dullness-none, rub- none           Chest wall-  Abd-  Br/ Gen/ Rectal- Not done, not indicated Extrem- cyanosis- none, clubbing, none, atrophy- none, strength- nl Neuro- grossly intact to observation

## 2014-07-20 NOTE — Patient Instructions (Signed)
Please call if we can help 

## 2014-08-15 DIAGNOSIS — M9903 Segmental and somatic dysfunction of lumbar region: Secondary | ICD-10-CM | POA: Diagnosis not present

## 2014-08-15 DIAGNOSIS — M9902 Segmental and somatic dysfunction of thoracic region: Secondary | ICD-10-CM | POA: Diagnosis not present

## 2014-08-15 DIAGNOSIS — M542 Cervicalgia: Secondary | ICD-10-CM | POA: Diagnosis not present

## 2014-08-15 DIAGNOSIS — M9901 Segmental and somatic dysfunction of cervical region: Secondary | ICD-10-CM | POA: Diagnosis not present

## 2014-08-15 DIAGNOSIS — M5032 Other cervical disc degeneration, mid-cervical region: Secondary | ICD-10-CM | POA: Diagnosis not present

## 2014-08-15 DIAGNOSIS — M9905 Segmental and somatic dysfunction of pelvic region: Secondary | ICD-10-CM | POA: Diagnosis not present

## 2014-08-15 DIAGNOSIS — M47812 Spondylosis without myelopathy or radiculopathy, cervical region: Secondary | ICD-10-CM | POA: Diagnosis not present

## 2014-08-15 DIAGNOSIS — M546 Pain in thoracic spine: Secondary | ICD-10-CM | POA: Diagnosis not present

## 2014-08-17 DIAGNOSIS — D3131 Benign neoplasm of right choroid: Secondary | ICD-10-CM | POA: Diagnosis not present

## 2014-08-17 DIAGNOSIS — Z961 Presence of intraocular lens: Secondary | ICD-10-CM | POA: Diagnosis not present

## 2014-08-21 DIAGNOSIS — M65311 Trigger thumb, right thumb: Secondary | ICD-10-CM | POA: Diagnosis not present

## 2014-08-21 DIAGNOSIS — M65342 Trigger finger, left ring finger: Secondary | ICD-10-CM | POA: Diagnosis not present

## 2014-08-23 DIAGNOSIS — R7881 Bacteremia: Secondary | ICD-10-CM | POA: Diagnosis not present

## 2014-08-23 DIAGNOSIS — N390 Urinary tract infection, site not specified: Secondary | ICD-10-CM | POA: Diagnosis not present

## 2014-08-23 DIAGNOSIS — Z0000 Encounter for general adult medical examination without abnormal findings: Secondary | ICD-10-CM | POA: Diagnosis not present

## 2014-08-24 DIAGNOSIS — Z008 Encounter for other general examination: Secondary | ICD-10-CM | POA: Diagnosis not present

## 2014-08-24 DIAGNOSIS — M810 Age-related osteoporosis without current pathological fracture: Secondary | ICD-10-CM | POA: Diagnosis not present

## 2014-08-24 DIAGNOSIS — E785 Hyperlipidemia, unspecified: Secondary | ICD-10-CM | POA: Diagnosis not present

## 2014-08-25 DIAGNOSIS — Z720 Tobacco use: Secondary | ICD-10-CM | POA: Diagnosis not present

## 2014-08-25 DIAGNOSIS — Z6821 Body mass index (BMI) 21.0-21.9, adult: Secondary | ICD-10-CM | POA: Diagnosis not present

## 2014-08-25 DIAGNOSIS — Z0000 Encounter for general adult medical examination without abnormal findings: Secondary | ICD-10-CM | POA: Diagnosis not present

## 2014-08-25 DIAGNOSIS — F329 Major depressive disorder, single episode, unspecified: Secondary | ICD-10-CM | POA: Diagnosis not present

## 2014-08-25 DIAGNOSIS — G4700 Insomnia, unspecified: Secondary | ICD-10-CM | POA: Diagnosis not present

## 2014-08-25 DIAGNOSIS — M810 Age-related osteoporosis without current pathological fracture: Secondary | ICD-10-CM | POA: Diagnosis not present

## 2014-08-25 DIAGNOSIS — C50919 Malignant neoplasm of unspecified site of unspecified female breast: Secondary | ICD-10-CM | POA: Diagnosis not present

## 2014-08-25 DIAGNOSIS — E785 Hyperlipidemia, unspecified: Secondary | ICD-10-CM | POA: Diagnosis not present

## 2014-12-13 DIAGNOSIS — M9901 Segmental and somatic dysfunction of cervical region: Secondary | ICD-10-CM | POA: Diagnosis not present

## 2014-12-13 DIAGNOSIS — M9905 Segmental and somatic dysfunction of pelvic region: Secondary | ICD-10-CM | POA: Diagnosis not present

## 2014-12-13 DIAGNOSIS — M9903 Segmental and somatic dysfunction of lumbar region: Secondary | ICD-10-CM | POA: Diagnosis not present

## 2014-12-13 DIAGNOSIS — M47812 Spondylosis without myelopathy or radiculopathy, cervical region: Secondary | ICD-10-CM | POA: Diagnosis not present

## 2014-12-13 DIAGNOSIS — M542 Cervicalgia: Secondary | ICD-10-CM | POA: Diagnosis not present

## 2014-12-13 DIAGNOSIS — M5116 Intervertebral disc disorders with radiculopathy, lumbar region: Secondary | ICD-10-CM | POA: Diagnosis not present

## 2014-12-13 DIAGNOSIS — M9902 Segmental and somatic dysfunction of thoracic region: Secondary | ICD-10-CM | POA: Diagnosis not present

## 2014-12-13 DIAGNOSIS — M545 Low back pain: Secondary | ICD-10-CM | POA: Diagnosis not present

## 2014-12-13 DIAGNOSIS — M546 Pain in thoracic spine: Secondary | ICD-10-CM | POA: Diagnosis not present

## 2014-12-13 DIAGNOSIS — M5032 Other cervical disc degeneration, mid-cervical region: Secondary | ICD-10-CM | POA: Diagnosis not present

## 2014-12-14 DIAGNOSIS — M9901 Segmental and somatic dysfunction of cervical region: Secondary | ICD-10-CM | POA: Diagnosis not present

## 2014-12-14 DIAGNOSIS — M9905 Segmental and somatic dysfunction of pelvic region: Secondary | ICD-10-CM | POA: Diagnosis not present

## 2014-12-14 DIAGNOSIS — M5032 Other cervical disc degeneration, mid-cervical region: Secondary | ICD-10-CM | POA: Diagnosis not present

## 2014-12-14 DIAGNOSIS — M9903 Segmental and somatic dysfunction of lumbar region: Secondary | ICD-10-CM | POA: Diagnosis not present

## 2014-12-14 DIAGNOSIS — M9902 Segmental and somatic dysfunction of thoracic region: Secondary | ICD-10-CM | POA: Diagnosis not present

## 2014-12-14 DIAGNOSIS — M545 Low back pain: Secondary | ICD-10-CM | POA: Diagnosis not present

## 2014-12-14 DIAGNOSIS — M542 Cervicalgia: Secondary | ICD-10-CM | POA: Diagnosis not present

## 2014-12-14 DIAGNOSIS — M5116 Intervertebral disc disorders with radiculopathy, lumbar region: Secondary | ICD-10-CM | POA: Diagnosis not present

## 2014-12-14 DIAGNOSIS — M47812 Spondylosis without myelopathy or radiculopathy, cervical region: Secondary | ICD-10-CM | POA: Diagnosis not present

## 2014-12-14 DIAGNOSIS — M546 Pain in thoracic spine: Secondary | ICD-10-CM | POA: Diagnosis not present

## 2014-12-26 DIAGNOSIS — C50919 Malignant neoplasm of unspecified site of unspecified female breast: Secondary | ICD-10-CM | POA: Diagnosis not present

## 2014-12-27 DIAGNOSIS — M5116 Intervertebral disc disorders with radiculopathy, lumbar region: Secondary | ICD-10-CM | POA: Diagnosis not present

## 2014-12-27 DIAGNOSIS — M542 Cervicalgia: Secondary | ICD-10-CM | POA: Diagnosis not present

## 2014-12-27 DIAGNOSIS — M9901 Segmental and somatic dysfunction of cervical region: Secondary | ICD-10-CM | POA: Diagnosis not present

## 2014-12-27 DIAGNOSIS — M9903 Segmental and somatic dysfunction of lumbar region: Secondary | ICD-10-CM | POA: Diagnosis not present

## 2014-12-27 DIAGNOSIS — M545 Low back pain: Secondary | ICD-10-CM | POA: Diagnosis not present

## 2014-12-27 DIAGNOSIS — M5032 Other cervical disc degeneration, mid-cervical region: Secondary | ICD-10-CM | POA: Diagnosis not present

## 2014-12-27 DIAGNOSIS — M9902 Segmental and somatic dysfunction of thoracic region: Secondary | ICD-10-CM | POA: Diagnosis not present

## 2014-12-27 DIAGNOSIS — M47812 Spondylosis without myelopathy or radiculopathy, cervical region: Secondary | ICD-10-CM | POA: Diagnosis not present

## 2014-12-27 DIAGNOSIS — M9905 Segmental and somatic dysfunction of pelvic region: Secondary | ICD-10-CM | POA: Diagnosis not present

## 2014-12-27 DIAGNOSIS — M546 Pain in thoracic spine: Secondary | ICD-10-CM | POA: Diagnosis not present

## 2015-01-16 DIAGNOSIS — M47812 Spondylosis without myelopathy or radiculopathy, cervical region: Secondary | ICD-10-CM | POA: Diagnosis not present

## 2015-01-16 DIAGNOSIS — M546 Pain in thoracic spine: Secondary | ICD-10-CM | POA: Diagnosis not present

## 2015-01-16 DIAGNOSIS — M9903 Segmental and somatic dysfunction of lumbar region: Secondary | ICD-10-CM | POA: Diagnosis not present

## 2015-01-16 DIAGNOSIS — M542 Cervicalgia: Secondary | ICD-10-CM | POA: Diagnosis not present

## 2015-01-16 DIAGNOSIS — M9905 Segmental and somatic dysfunction of pelvic region: Secondary | ICD-10-CM | POA: Diagnosis not present

## 2015-01-16 DIAGNOSIS — M9901 Segmental and somatic dysfunction of cervical region: Secondary | ICD-10-CM | POA: Diagnosis not present

## 2015-01-16 DIAGNOSIS — M9902 Segmental and somatic dysfunction of thoracic region: Secondary | ICD-10-CM | POA: Diagnosis not present

## 2015-01-16 DIAGNOSIS — M5032 Other cervical disc degeneration, mid-cervical region: Secondary | ICD-10-CM | POA: Diagnosis not present

## 2015-01-16 DIAGNOSIS — M5116 Intervertebral disc disorders with radiculopathy, lumbar region: Secondary | ICD-10-CM | POA: Diagnosis not present

## 2015-01-16 DIAGNOSIS — M545 Low back pain: Secondary | ICD-10-CM | POA: Diagnosis not present

## 2015-01-23 DIAGNOSIS — N390 Urinary tract infection, site not specified: Secondary | ICD-10-CM | POA: Diagnosis not present

## 2015-01-23 DIAGNOSIS — R8299 Other abnormal findings in urine: Secondary | ICD-10-CM | POA: Diagnosis not present

## 2015-01-23 DIAGNOSIS — R300 Dysuria: Secondary | ICD-10-CM | POA: Diagnosis not present

## 2015-01-30 DIAGNOSIS — N201 Calculus of ureter: Secondary | ICD-10-CM | POA: Diagnosis not present

## 2015-01-30 DIAGNOSIS — R319 Hematuria, unspecified: Secondary | ICD-10-CM | POA: Diagnosis not present

## 2015-01-30 DIAGNOSIS — D259 Leiomyoma of uterus, unspecified: Secondary | ICD-10-CM | POA: Diagnosis not present

## 2015-01-30 DIAGNOSIS — K8020 Calculus of gallbladder without cholecystitis without obstruction: Secondary | ICD-10-CM | POA: Diagnosis not present

## 2015-01-31 DIAGNOSIS — Z01818 Encounter for other preprocedural examination: Secondary | ICD-10-CM | POA: Diagnosis not present

## 2015-02-05 DIAGNOSIS — Z79899 Other long term (current) drug therapy: Secondary | ICD-10-CM | POA: Diagnosis not present

## 2015-02-05 DIAGNOSIS — N201 Calculus of ureter: Secondary | ICD-10-CM | POA: Diagnosis not present

## 2015-02-05 DIAGNOSIS — N200 Calculus of kidney: Secondary | ICD-10-CM | POA: Diagnosis not present

## 2015-02-21 DIAGNOSIS — N200 Calculus of kidney: Secondary | ICD-10-CM | POA: Diagnosis not present

## 2015-02-26 DIAGNOSIS — M9902 Segmental and somatic dysfunction of thoracic region: Secondary | ICD-10-CM | POA: Diagnosis not present

## 2015-02-26 DIAGNOSIS — M47812 Spondylosis without myelopathy or radiculopathy, cervical region: Secondary | ICD-10-CM | POA: Diagnosis not present

## 2015-02-26 DIAGNOSIS — M9903 Segmental and somatic dysfunction of lumbar region: Secondary | ICD-10-CM | POA: Diagnosis not present

## 2015-02-26 DIAGNOSIS — R201 Hypoesthesia of skin: Secondary | ICD-10-CM | POA: Diagnosis not present

## 2015-02-26 DIAGNOSIS — M542 Cervicalgia: Secondary | ICD-10-CM | POA: Diagnosis not present

## 2015-02-26 DIAGNOSIS — M9901 Segmental and somatic dysfunction of cervical region: Secondary | ICD-10-CM | POA: Diagnosis not present

## 2015-02-26 DIAGNOSIS — M546 Pain in thoracic spine: Secondary | ICD-10-CM | POA: Diagnosis not present

## 2015-02-26 DIAGNOSIS — M9905 Segmental and somatic dysfunction of pelvic region: Secondary | ICD-10-CM | POA: Diagnosis not present

## 2015-02-26 DIAGNOSIS — M5032 Other cervical disc degeneration, mid-cervical region: Secondary | ICD-10-CM | POA: Diagnosis not present

## 2015-02-26 DIAGNOSIS — M545 Low back pain: Secondary | ICD-10-CM | POA: Diagnosis not present

## 2015-03-01 DIAGNOSIS — N200 Calculus of kidney: Secondary | ICD-10-CM | POA: Diagnosis not present

## 2015-03-02 DIAGNOSIS — N200 Calculus of kidney: Secondary | ICD-10-CM | POA: Diagnosis not present

## 2015-03-06 DIAGNOSIS — M9901 Segmental and somatic dysfunction of cervical region: Secondary | ICD-10-CM | POA: Diagnosis not present

## 2015-03-06 DIAGNOSIS — R201 Hypoesthesia of skin: Secondary | ICD-10-CM | POA: Diagnosis not present

## 2015-03-06 DIAGNOSIS — M9905 Segmental and somatic dysfunction of pelvic region: Secondary | ICD-10-CM | POA: Diagnosis not present

## 2015-03-06 DIAGNOSIS — M546 Pain in thoracic spine: Secondary | ICD-10-CM | POA: Diagnosis not present

## 2015-03-06 DIAGNOSIS — M9903 Segmental and somatic dysfunction of lumbar region: Secondary | ICD-10-CM | POA: Diagnosis not present

## 2015-03-06 DIAGNOSIS — M542 Cervicalgia: Secondary | ICD-10-CM | POA: Diagnosis not present

## 2015-03-06 DIAGNOSIS — M47812 Spondylosis without myelopathy or radiculopathy, cervical region: Secondary | ICD-10-CM | POA: Diagnosis not present

## 2015-03-06 DIAGNOSIS — M5032 Other cervical disc degeneration, mid-cervical region: Secondary | ICD-10-CM | POA: Diagnosis not present

## 2015-03-06 DIAGNOSIS — M9902 Segmental and somatic dysfunction of thoracic region: Secondary | ICD-10-CM | POA: Diagnosis not present

## 2015-03-06 DIAGNOSIS — M545 Low back pain: Secondary | ICD-10-CM | POA: Diagnosis not present

## 2015-03-12 DIAGNOSIS — R201 Hypoesthesia of skin: Secondary | ICD-10-CM | POA: Diagnosis not present

## 2015-03-12 DIAGNOSIS — M47812 Spondylosis without myelopathy or radiculopathy, cervical region: Secondary | ICD-10-CM | POA: Diagnosis not present

## 2015-03-12 DIAGNOSIS — M542 Cervicalgia: Secondary | ICD-10-CM | POA: Diagnosis not present

## 2015-03-12 DIAGNOSIS — M9902 Segmental and somatic dysfunction of thoracic region: Secondary | ICD-10-CM | POA: Diagnosis not present

## 2015-03-12 DIAGNOSIS — M545 Low back pain: Secondary | ICD-10-CM | POA: Diagnosis not present

## 2015-03-12 DIAGNOSIS — M9905 Segmental and somatic dysfunction of pelvic region: Secondary | ICD-10-CM | POA: Diagnosis not present

## 2015-03-12 DIAGNOSIS — M9903 Segmental and somatic dysfunction of lumbar region: Secondary | ICD-10-CM | POA: Diagnosis not present

## 2015-03-12 DIAGNOSIS — M5032 Other cervical disc degeneration, mid-cervical region: Secondary | ICD-10-CM | POA: Diagnosis not present

## 2015-03-12 DIAGNOSIS — M9901 Segmental and somatic dysfunction of cervical region: Secondary | ICD-10-CM | POA: Diagnosis not present

## 2015-03-12 DIAGNOSIS — M546 Pain in thoracic spine: Secondary | ICD-10-CM | POA: Diagnosis not present

## 2015-03-26 DIAGNOSIS — R509 Fever, unspecified: Secondary | ICD-10-CM | POA: Diagnosis not present

## 2015-03-26 DIAGNOSIS — R05 Cough: Secondary | ICD-10-CM | POA: Diagnosis not present

## 2015-03-26 DIAGNOSIS — J441 Chronic obstructive pulmonary disease with (acute) exacerbation: Secondary | ICD-10-CM | POA: Diagnosis not present

## 2015-03-26 DIAGNOSIS — Z6820 Body mass index (BMI) 20.0-20.9, adult: Secondary | ICD-10-CM | POA: Diagnosis not present

## 2015-03-26 DIAGNOSIS — J309 Allergic rhinitis, unspecified: Secondary | ICD-10-CM | POA: Diagnosis not present

## 2015-03-26 DIAGNOSIS — Z720 Tobacco use: Secondary | ICD-10-CM | POA: Diagnosis not present

## 2015-04-03 DIAGNOSIS — M9905 Segmental and somatic dysfunction of pelvic region: Secondary | ICD-10-CM | POA: Diagnosis not present

## 2015-04-03 DIAGNOSIS — M546 Pain in thoracic spine: Secondary | ICD-10-CM | POA: Diagnosis not present

## 2015-04-03 DIAGNOSIS — M545 Low back pain: Secondary | ICD-10-CM | POA: Diagnosis not present

## 2015-04-03 DIAGNOSIS — R201 Hypoesthesia of skin: Secondary | ICD-10-CM | POA: Diagnosis not present

## 2015-04-03 DIAGNOSIS — M47812 Spondylosis without myelopathy or radiculopathy, cervical region: Secondary | ICD-10-CM | POA: Diagnosis not present

## 2015-04-03 DIAGNOSIS — M9901 Segmental and somatic dysfunction of cervical region: Secondary | ICD-10-CM | POA: Diagnosis not present

## 2015-04-03 DIAGNOSIS — M9902 Segmental and somatic dysfunction of thoracic region: Secondary | ICD-10-CM | POA: Diagnosis not present

## 2015-04-03 DIAGNOSIS — M5032 Other cervical disc degeneration, mid-cervical region: Secondary | ICD-10-CM | POA: Diagnosis not present

## 2015-04-03 DIAGNOSIS — M9903 Segmental and somatic dysfunction of lumbar region: Secondary | ICD-10-CM | POA: Diagnosis not present

## 2015-04-03 DIAGNOSIS — M542 Cervicalgia: Secondary | ICD-10-CM | POA: Diagnosis not present

## 2015-05-28 DIAGNOSIS — M9902 Segmental and somatic dysfunction of thoracic region: Secondary | ICD-10-CM | POA: Diagnosis not present

## 2015-05-28 DIAGNOSIS — M5032 Other cervical disc degeneration, mid-cervical region: Secondary | ICD-10-CM | POA: Diagnosis not present

## 2015-05-28 DIAGNOSIS — R201 Hypoesthesia of skin: Secondary | ICD-10-CM | POA: Diagnosis not present

## 2015-05-28 DIAGNOSIS — M542 Cervicalgia: Secondary | ICD-10-CM | POA: Diagnosis not present

## 2015-05-28 DIAGNOSIS — M47812 Spondylosis without myelopathy or radiculopathy, cervical region: Secondary | ICD-10-CM | POA: Diagnosis not present

## 2015-05-28 DIAGNOSIS — M9905 Segmental and somatic dysfunction of pelvic region: Secondary | ICD-10-CM | POA: Diagnosis not present

## 2015-05-28 DIAGNOSIS — M546 Pain in thoracic spine: Secondary | ICD-10-CM | POA: Diagnosis not present

## 2015-05-28 DIAGNOSIS — M545 Low back pain: Secondary | ICD-10-CM | POA: Diagnosis not present

## 2015-05-28 DIAGNOSIS — M9903 Segmental and somatic dysfunction of lumbar region: Secondary | ICD-10-CM | POA: Diagnosis not present

## 2015-05-28 DIAGNOSIS — M9901 Segmental and somatic dysfunction of cervical region: Secondary | ICD-10-CM | POA: Diagnosis not present

## 2015-05-30 DIAGNOSIS — N200 Calculus of kidney: Secondary | ICD-10-CM | POA: Diagnosis not present

## 2015-06-21 DIAGNOSIS — M9901 Segmental and somatic dysfunction of cervical region: Secondary | ICD-10-CM | POA: Diagnosis not present

## 2015-06-21 DIAGNOSIS — M50322 Other cervical disc degeneration at C5-C6 level: Secondary | ICD-10-CM | POA: Diagnosis not present

## 2015-06-21 DIAGNOSIS — M542 Cervicalgia: Secondary | ICD-10-CM | POA: Diagnosis not present

## 2015-06-21 DIAGNOSIS — M47812 Spondylosis without myelopathy or radiculopathy, cervical region: Secondary | ICD-10-CM | POA: Diagnosis not present

## 2015-06-21 DIAGNOSIS — R201 Hypoesthesia of skin: Secondary | ICD-10-CM | POA: Diagnosis not present

## 2015-06-21 DIAGNOSIS — M546 Pain in thoracic spine: Secondary | ICD-10-CM | POA: Diagnosis not present

## 2015-06-21 DIAGNOSIS — M9902 Segmental and somatic dysfunction of thoracic region: Secondary | ICD-10-CM | POA: Diagnosis not present

## 2015-06-21 DIAGNOSIS — M545 Low back pain: Secondary | ICD-10-CM | POA: Diagnosis not present

## 2015-06-21 DIAGNOSIS — M9903 Segmental and somatic dysfunction of lumbar region: Secondary | ICD-10-CM | POA: Diagnosis not present

## 2015-06-21 DIAGNOSIS — M9905 Segmental and somatic dysfunction of pelvic region: Secondary | ICD-10-CM | POA: Diagnosis not present

## 2015-06-25 DIAGNOSIS — M65342 Trigger finger, left ring finger: Secondary | ICD-10-CM | POA: Diagnosis not present

## 2015-06-25 DIAGNOSIS — M19042 Primary osteoarthritis, left hand: Secondary | ICD-10-CM | POA: Diagnosis not present

## 2015-06-25 DIAGNOSIS — M19041 Primary osteoarthritis, right hand: Secondary | ICD-10-CM | POA: Diagnosis not present

## 2015-07-02 DIAGNOSIS — M9903 Segmental and somatic dysfunction of lumbar region: Secondary | ICD-10-CM | POA: Diagnosis not present

## 2015-07-02 DIAGNOSIS — M9905 Segmental and somatic dysfunction of pelvic region: Secondary | ICD-10-CM | POA: Diagnosis not present

## 2015-07-02 DIAGNOSIS — M9901 Segmental and somatic dysfunction of cervical region: Secondary | ICD-10-CM | POA: Diagnosis not present

## 2015-07-02 DIAGNOSIS — M542 Cervicalgia: Secondary | ICD-10-CM | POA: Diagnosis not present

## 2015-07-02 DIAGNOSIS — R201 Hypoesthesia of skin: Secondary | ICD-10-CM | POA: Diagnosis not present

## 2015-07-02 DIAGNOSIS — M50322 Other cervical disc degeneration at C5-C6 level: Secondary | ICD-10-CM | POA: Diagnosis not present

## 2015-07-02 DIAGNOSIS — M545 Low back pain: Secondary | ICD-10-CM | POA: Diagnosis not present

## 2015-07-02 DIAGNOSIS — M546 Pain in thoracic spine: Secondary | ICD-10-CM | POA: Diagnosis not present

## 2015-07-02 DIAGNOSIS — M47812 Spondylosis without myelopathy or radiculopathy, cervical region: Secondary | ICD-10-CM | POA: Diagnosis not present

## 2015-07-02 DIAGNOSIS — M9902 Segmental and somatic dysfunction of thoracic region: Secondary | ICD-10-CM | POA: Diagnosis not present

## 2015-07-24 DIAGNOSIS — M19042 Primary osteoarthritis, left hand: Secondary | ICD-10-CM | POA: Diagnosis not present

## 2015-07-24 DIAGNOSIS — M65342 Trigger finger, left ring finger: Secondary | ICD-10-CM | POA: Diagnosis not present

## 2015-07-24 DIAGNOSIS — M19041 Primary osteoarthritis, right hand: Secondary | ICD-10-CM | POA: Diagnosis not present

## 2015-07-26 ENCOUNTER — Ambulatory Visit (INDEPENDENT_AMBULATORY_CARE_PROVIDER_SITE_OTHER): Payer: Medicare Other | Admitting: Internal Medicine

## 2015-07-26 ENCOUNTER — Encounter: Payer: Self-pay | Admitting: Internal Medicine

## 2015-07-26 VITALS — BP 120/68 | HR 69 | Ht 66.0 in | Wt 135.8 lb

## 2015-07-26 DIAGNOSIS — R911 Solitary pulmonary nodule: Secondary | ICD-10-CM | POA: Diagnosis not present

## 2015-07-26 DIAGNOSIS — Z23 Encounter for immunization: Secondary | ICD-10-CM | POA: Diagnosis not present

## 2015-07-26 DIAGNOSIS — F17200 Nicotine dependence, unspecified, uncomplicated: Secondary | ICD-10-CM

## 2015-07-26 MED ORDER — TETANUS-DIPHTH-ACELL PERTUSSIS 5-2.5-18.5 LF-MCG/0.5 IM SUSP: 0.5000 mL | Freq: Once | INTRAMUSCULAR | Status: DC

## 2015-07-26 NOTE — Patient Instructions (Signed)
TDAP vaccine   Dx lung nodule

## 2015-07-26 NOTE — Progress Notes (Signed)
06/24/11- 74 year old female active smoker followed for lung nodule, bronchitis, tobacco use. Last here- 06/25/2010. She is still smoking and not interested in trying to quit. She declines flu vaccine. She did not want her blood pressure checked but finally agreed to let me check it as long as she could continue to wear her sweater over her arm. She asks refills of her routine meds which were reviewed. Takes occasional Sudafed for nasal congestion. Would like to have Biaxin available. Has a rescue inhaler she uses only rarely. Denies wheeze chest tightness routine cough or phlegm bloody or purulent sputum chest pain palpitations or swollen glands. Pending varicose vein surgery.  06/22/12- 74 year old female active smoker followed for lung nodule, bronchitis, tobacco use. Last here- 06/25/2010. 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 Her continued smoking is likely to irritate her cords as well-discussed with her. She took prednisone and antibiotic for a summer cold. Continue Sudafed and antihistamines each morning and one half antihistamine( clemastine) at bedtime  CXR 06/26/11- reviewed with her IMPRESSION:  COPD. No active cardiopulmonary disease.  Original Report Authenticated By: Raelyn Number, M.D.   07/19/13- 74 year old female active smoker followed for lung nodule, bronchitis, tobacco use. FOLLOWS FOR: had 4 month long sore throat-went to ENT and was told  she had strained vocal cords and to rest voice-this worked. Otherwise no SOB, wheezing, cough, or congestion. She declines flu vaccine and has made no effort to stop smoking No acute respiratory events in the past year. Denies reflux. Has held a prednisone taper prescription but not filled. Dieted off 20 pounds for her son's wedding  (anesthesiologist in [place]) She did not get the chest x-ray we planned after last visit  07/20/14-  74 year old female active smoker followed for lung nodule, bronchitis, tobacco use. FOLLOWS FOR:not hoarse,Pr. headaches,dizziness,no cough or sob  CT 10/21/2013 16:10 IMPRESSION: 1. Liver is mildly enlarged and appears mildly cirrhotic. Focal hyper attenuating lesion in the posterior right hepatic lobe is incompletely characterized and difficult to definitively confirm as being present on the prior exam. While a flash fill hemangioma or perfusion anomaly can have this appearance, further evaluation with MR abdomen without and with contrast should be considered for more definitive characterization. 2. Otherwise, no evidence of metastatic disease. 3. Three-vessel coronary artery calcification. 4. Cholelithiasis. Electronically Signed  By: Lorin Picket M.D.  On: 10/21/2013 16:10  07/26/15- 74 year old female active smoker followed for lung nodule, bronchitis, tobacco use, complicated by history L breast cancer, CAD FOLLOWS FOR: pt would like to know id she should get Tdap now since having whooping cough earlier this year. CT 10/21/13-Scattered pulmonary nodular densities are unchanged from 05/04/2009 and are therefore considered benign. No new pulmonary Nodules. She says she was diagnosed with whooping cough earlier this year based on lab tests and has finally recovered. She refuses flu shot but asks for TDAP-discussed Head MR scan workup of liver lesion noted on last chest x-ray-benign She still is not interested in trying to stop smoking.  ROS-see HPI Constitutional:   No-   weight loss, night sweats, fevers, chills, fatigue, lassitude. HEENT:   No-  headaches, difficulty swallowing, tooth/dental problems, sore throat,       No-  sneezing, itching, ear ache, +nasal congestion, post nasal drip,  CV:  No-   chest pain, orthopnea, PND, swelling  in lower extremities, anasarca,  dizziness, palpitations Resp: No-   shortness of breath with exertion or at rest.              No-   productive cough,  No non-productive cough,  No-  coughing up of blood.              No-   change in color of mucus.  No- wheezing.   Skin: No-   rash or lesions. GI:  No-   heartburn, indigestion, abdominal pain, nausea, vomiting,  GU: MS:  No-   joint pain or swelling.   Neuro- nothing unusual Psych:  No- change in mood or affect. No depression or anxiety.  No memory loss.   OBJ General- Alert, Oriented, Affect-appropriate, Distress- none acute  She is trim, Talkative Skin- rash-none, lesions- none, excoriation- none Lymphadenopathy- none Head- atraumatic            Eyes- Gross vision intact, PERRLA, conjunctivae clear secretions            Ears- Hearing, canals-normal            Nose- Clear, no-Septal dev, mucus, polyps, erosion, perforation             Throat- Mallampati II-III , mucosa clear , drainage- none, tonsils- atrophic Neck- flexible , trachea midline, no stridor , thyroid nl, carotid no bruit Chest - symmetrical excursion , unlabored           Heart/CV- RRR , no murmur , no gallop  , no rub, nl s1 s2                           - JVD- none , edema- none, stasis changes- none, varices- none           Lung- clear to P&A, wheeze- none, cough- none , dullness-none, rub- none           Chest wall-  Abd-  Br/ Gen/ Rectal- Not done, not indicated Extrem- cyanosis- none, clubbing, none, atrophy- none, strength- nl Neuro- grossly intact to observation

## 2015-07-28 ENCOUNTER — Encounter: Payer: Self-pay | Admitting: Internal Medicine

## 2015-07-28 NOTE — Assessment & Plan Note (Signed)
There has been no progression to visability by chest x-ray over the years this lesion has been followed.

## 2015-07-28 NOTE — Assessment & Plan Note (Signed)
I have not been able to budge her with counseling efforts and offers of available support

## 2015-08-14 DIAGNOSIS — M9902 Segmental and somatic dysfunction of thoracic region: Secondary | ICD-10-CM | POA: Diagnosis not present

## 2015-08-14 DIAGNOSIS — M542 Cervicalgia: Secondary | ICD-10-CM | POA: Diagnosis not present

## 2015-08-14 DIAGNOSIS — M50322 Other cervical disc degeneration at C5-C6 level: Secondary | ICD-10-CM | POA: Diagnosis not present

## 2015-08-14 DIAGNOSIS — M546 Pain in thoracic spine: Secondary | ICD-10-CM | POA: Diagnosis not present

## 2015-08-14 DIAGNOSIS — M9903 Segmental and somatic dysfunction of lumbar region: Secondary | ICD-10-CM | POA: Diagnosis not present

## 2015-08-14 DIAGNOSIS — R201 Hypoesthesia of skin: Secondary | ICD-10-CM | POA: Diagnosis not present

## 2015-08-14 DIAGNOSIS — M545 Low back pain: Secondary | ICD-10-CM | POA: Diagnosis not present

## 2015-08-14 DIAGNOSIS — M9905 Segmental and somatic dysfunction of pelvic region: Secondary | ICD-10-CM | POA: Diagnosis not present

## 2015-08-14 DIAGNOSIS — M47812 Spondylosis without myelopathy or radiculopathy, cervical region: Secondary | ICD-10-CM | POA: Diagnosis not present

## 2015-08-14 DIAGNOSIS — M9901 Segmental and somatic dysfunction of cervical region: Secondary | ICD-10-CM | POA: Diagnosis not present

## 2015-08-28 DIAGNOSIS — R8299 Other abnormal findings in urine: Secondary | ICD-10-CM | POA: Diagnosis not present

## 2015-08-28 DIAGNOSIS — M810 Age-related osteoporosis without current pathological fracture: Secondary | ICD-10-CM | POA: Diagnosis not present

## 2015-08-28 DIAGNOSIS — N390 Urinary tract infection, site not specified: Secondary | ICD-10-CM | POA: Diagnosis not present

## 2015-08-28 DIAGNOSIS — E784 Other hyperlipidemia: Secondary | ICD-10-CM | POA: Diagnosis not present

## 2015-08-31 DIAGNOSIS — Z1389 Encounter for screening for other disorder: Secondary | ICD-10-CM | POA: Diagnosis not present

## 2015-08-31 DIAGNOSIS — Z0000 Encounter for general adult medical examination without abnormal findings: Secondary | ICD-10-CM | POA: Diagnosis not present

## 2015-08-31 DIAGNOSIS — J439 Emphysema, unspecified: Secondary | ICD-10-CM | POA: Diagnosis not present

## 2015-08-31 DIAGNOSIS — E784 Other hyperlipidemia: Secondary | ICD-10-CM | POA: Diagnosis not present

## 2015-08-31 DIAGNOSIS — M542 Cervicalgia: Secondary | ICD-10-CM | POA: Diagnosis not present

## 2015-08-31 DIAGNOSIS — C50919 Malignant neoplasm of unspecified site of unspecified female breast: Secondary | ICD-10-CM | POA: Diagnosis not present

## 2015-08-31 DIAGNOSIS — Z6822 Body mass index (BMI) 22.0-22.9, adult: Secondary | ICD-10-CM | POA: Diagnosis not present

## 2015-08-31 DIAGNOSIS — M810 Age-related osteoporosis without current pathological fracture: Secondary | ICD-10-CM | POA: Diagnosis not present

## 2015-08-31 DIAGNOSIS — G4709 Other insomnia: Secondary | ICD-10-CM | POA: Diagnosis not present

## 2015-08-31 DIAGNOSIS — Z720 Tobacco use: Secondary | ICD-10-CM | POA: Diagnosis not present

## 2015-08-31 DIAGNOSIS — F329 Major depressive disorder, single episode, unspecified: Secondary | ICD-10-CM | POA: Diagnosis not present

## 2015-08-31 DIAGNOSIS — N200 Calculus of kidney: Secondary | ICD-10-CM | POA: Diagnosis not present

## 2015-10-04 DIAGNOSIS — Z961 Presence of intraocular lens: Secondary | ICD-10-CM | POA: Diagnosis not present

## 2015-10-30 DIAGNOSIS — M9901 Segmental and somatic dysfunction of cervical region: Secondary | ICD-10-CM | POA: Diagnosis not present

## 2015-10-30 DIAGNOSIS — M542 Cervicalgia: Secondary | ICD-10-CM | POA: Diagnosis not present

## 2015-10-30 DIAGNOSIS — M7541 Impingement syndrome of right shoulder: Secondary | ICD-10-CM | POA: Diagnosis not present

## 2015-10-30 DIAGNOSIS — M9902 Segmental and somatic dysfunction of thoracic region: Secondary | ICD-10-CM | POA: Diagnosis not present

## 2015-10-30 DIAGNOSIS — M545 Low back pain: Secondary | ICD-10-CM | POA: Diagnosis not present

## 2015-10-30 DIAGNOSIS — M9903 Segmental and somatic dysfunction of lumbar region: Secondary | ICD-10-CM | POA: Diagnosis not present

## 2015-10-30 DIAGNOSIS — M546 Pain in thoracic spine: Secondary | ICD-10-CM | POA: Diagnosis not present

## 2015-10-30 DIAGNOSIS — M47812 Spondylosis without myelopathy or radiculopathy, cervical region: Secondary | ICD-10-CM | POA: Diagnosis not present

## 2015-10-30 DIAGNOSIS — M9905 Segmental and somatic dysfunction of pelvic region: Secondary | ICD-10-CM | POA: Diagnosis not present

## 2015-10-30 DIAGNOSIS — M50322 Other cervical disc degeneration at C5-C6 level: Secondary | ICD-10-CM | POA: Diagnosis not present

## 2015-11-06 DIAGNOSIS — M47812 Spondylosis without myelopathy or radiculopathy, cervical region: Secondary | ICD-10-CM | POA: Diagnosis not present

## 2015-11-06 DIAGNOSIS — M50322 Other cervical disc degeneration at C5-C6 level: Secondary | ICD-10-CM | POA: Diagnosis not present

## 2015-11-06 DIAGNOSIS — M545 Low back pain: Secondary | ICD-10-CM | POA: Diagnosis not present

## 2015-11-06 DIAGNOSIS — M9901 Segmental and somatic dysfunction of cervical region: Secondary | ICD-10-CM | POA: Diagnosis not present

## 2015-11-06 DIAGNOSIS — M9903 Segmental and somatic dysfunction of lumbar region: Secondary | ICD-10-CM | POA: Diagnosis not present

## 2015-11-06 DIAGNOSIS — M9905 Segmental and somatic dysfunction of pelvic region: Secondary | ICD-10-CM | POA: Diagnosis not present

## 2015-11-06 DIAGNOSIS — M546 Pain in thoracic spine: Secondary | ICD-10-CM | POA: Diagnosis not present

## 2015-11-06 DIAGNOSIS — M542 Cervicalgia: Secondary | ICD-10-CM | POA: Diagnosis not present

## 2015-11-06 DIAGNOSIS — M9902 Segmental and somatic dysfunction of thoracic region: Secondary | ICD-10-CM | POA: Diagnosis not present

## 2015-11-06 DIAGNOSIS — M7541 Impingement syndrome of right shoulder: Secondary | ICD-10-CM | POA: Diagnosis not present

## 2015-11-08 IMAGING — CT CT ABD-PELV W/ CM
2 of 5 series · 16 of 46 positions shown, 18 images · IV contrast (OMNIPAQUE)
Comparison: CT Chest, Abdomen, Pelvis w/o Contrast (In Office)
dated 05/04/2009

CLINICAL DATA: Stage II breast cancer. Evaluate for metastatic
disease.

EXAM:
CT CHEST, ABDOMEN, AND PELVIS WITH CONTRAST
TECHNIQUE: Multidetector CT imaging of the chest, abdomen and pelvis was
performed following the standard protocol during bolus
administration of intravenous contrast.
CONTRAST:  100mL OMNIPAQUE IOHEXOL 300 MG/ML  SOLN

[Series 2: cap with st · axial · 0.67mm/px · z∈[-610,-34]mm · 13 of 131 slices shown, 15 images]
[im 8/131  soft-tissue]
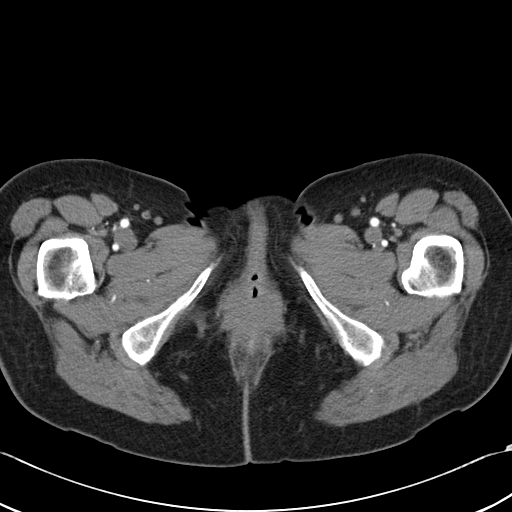
[im 8/131  bone]
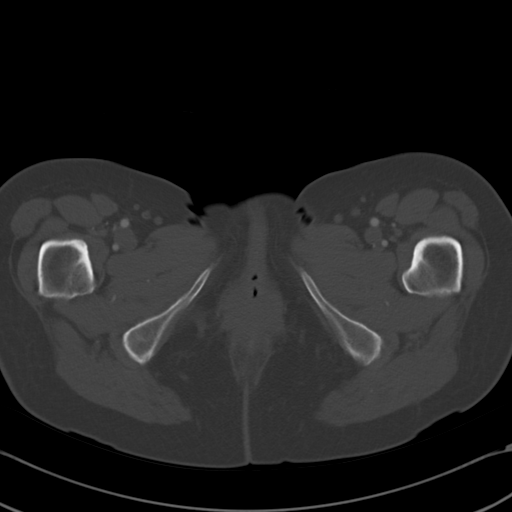
[im 15/131  soft-tissue]
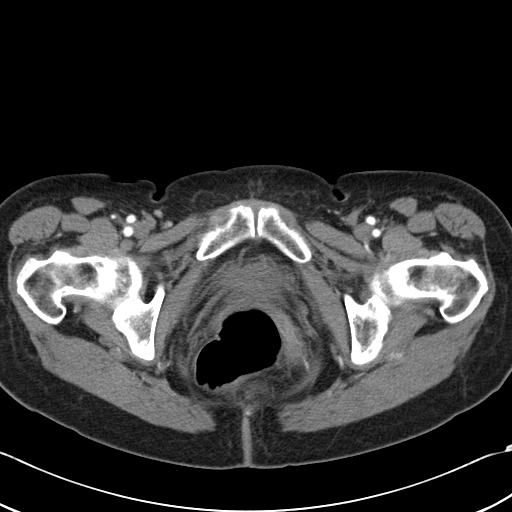
[im 29/131  soft-tissue]
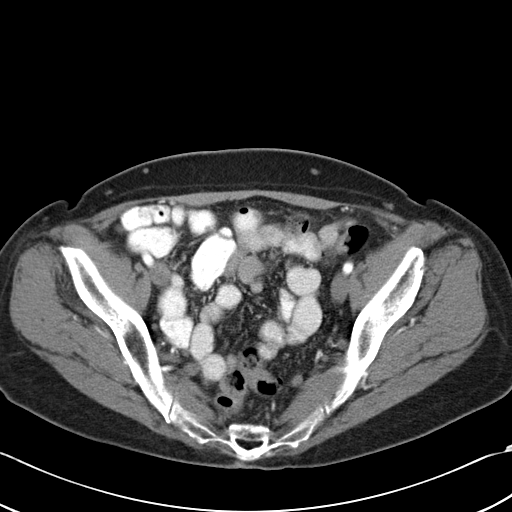
[im 37/131  soft-tissue]
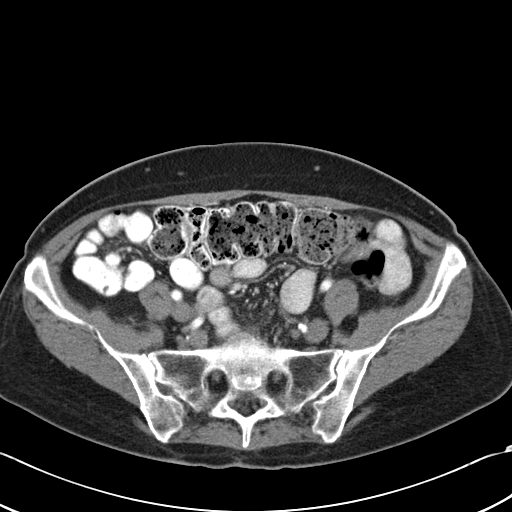
[im 44/131  soft-tissue]
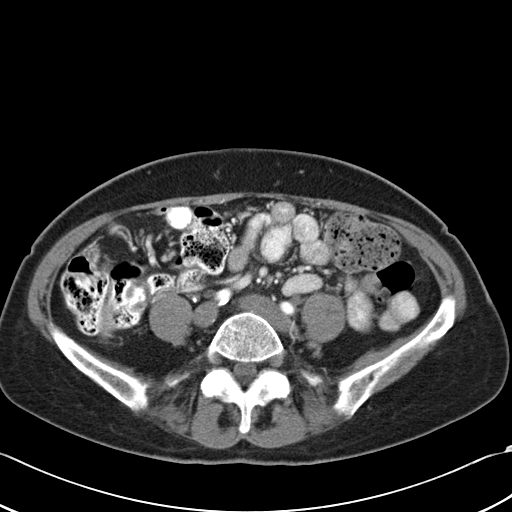
[im 58/131  soft-tissue]
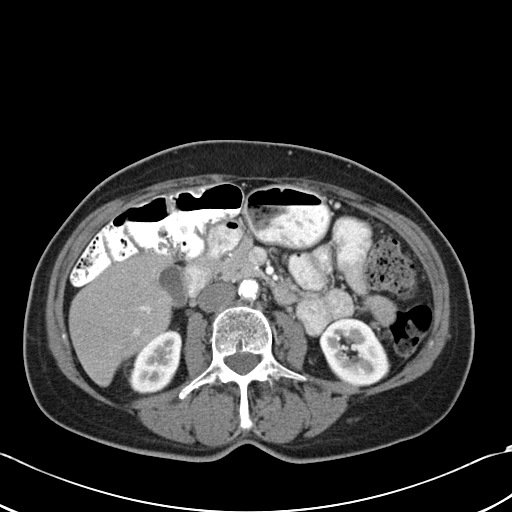
[im 66/131  soft-tissue]
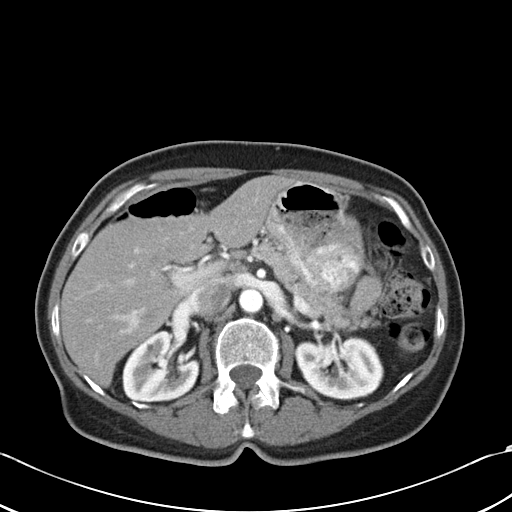
[im 73/131  soft-tissue]
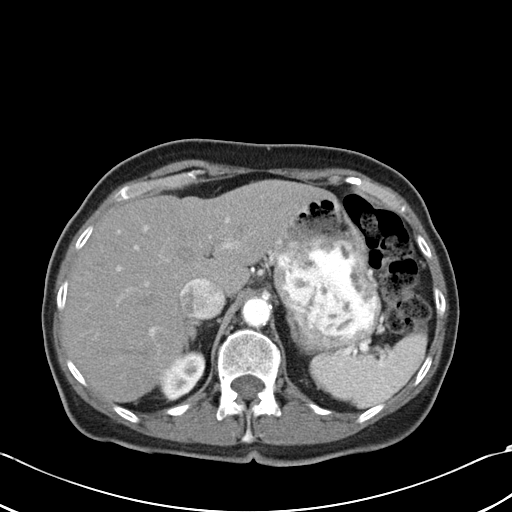
[im 87/131  soft-tissue]
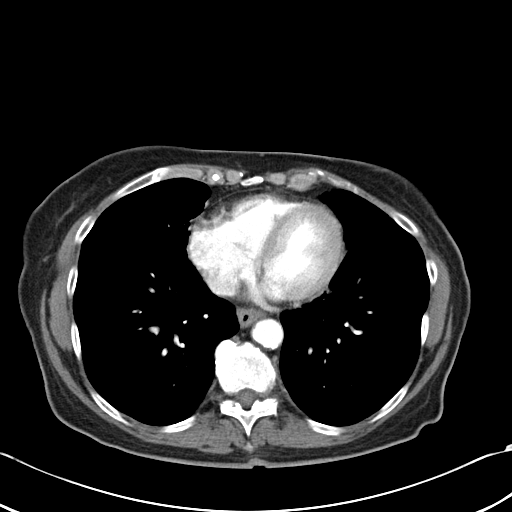
[im 87/131  bone]
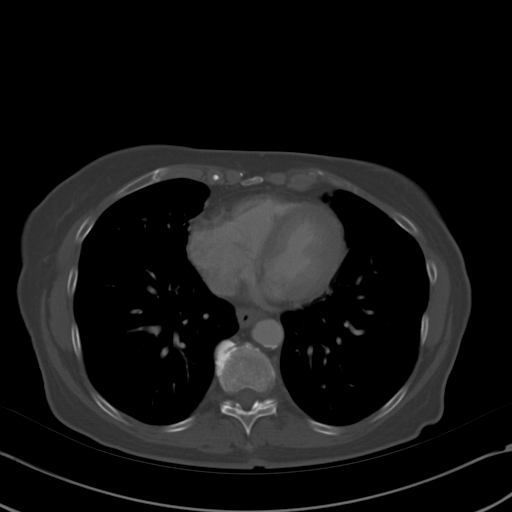
[im 94/131  soft-tissue]
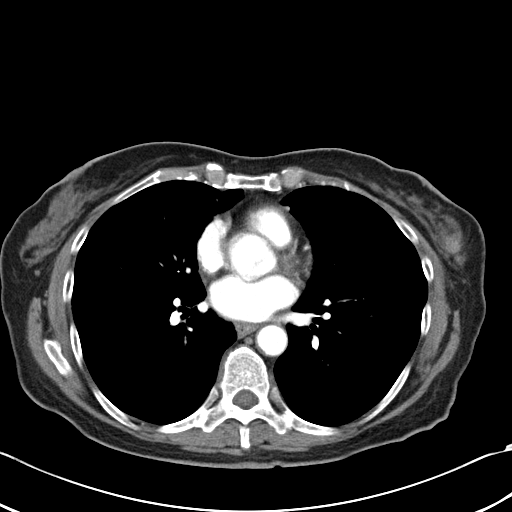
[im 102/131  soft-tissue]
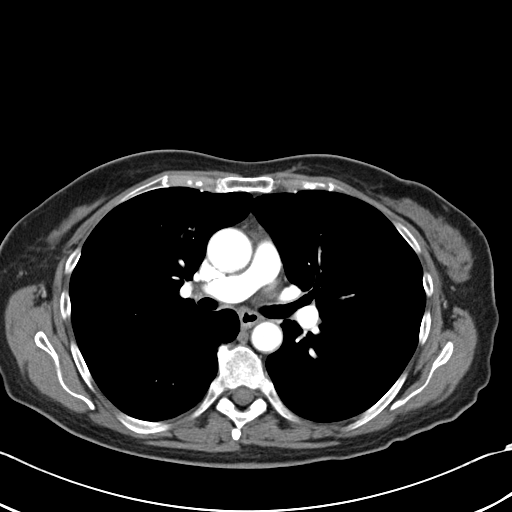
[im 116/131  soft-tissue]
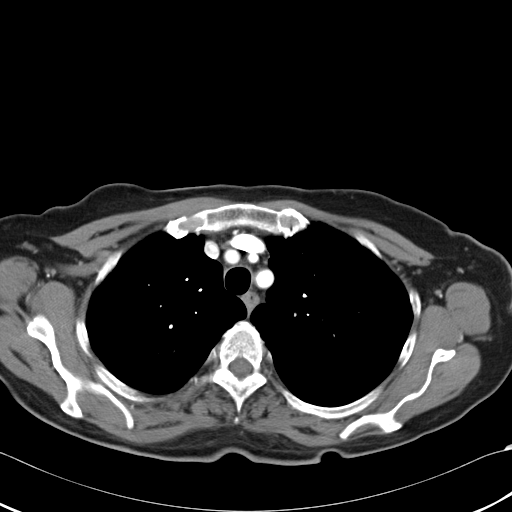
[im 123/131  soft-tissue]
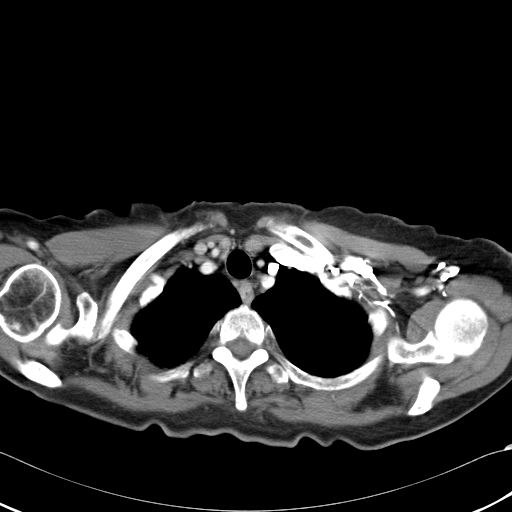

[Series 602: <mpr thick range> · coronal · 1.28mm/px · 3 of 76 slices shown]
[im 26/76  soft-tissue]
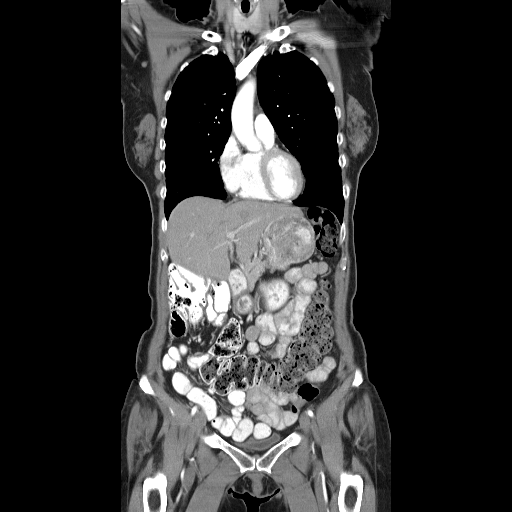
[im 34/76  soft-tissue]
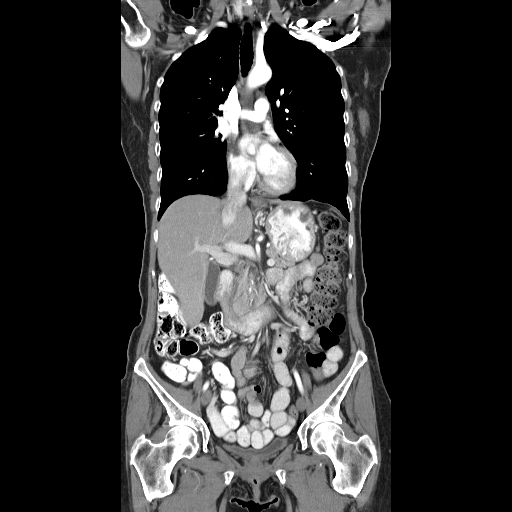
[im 42/76  soft-tissue]
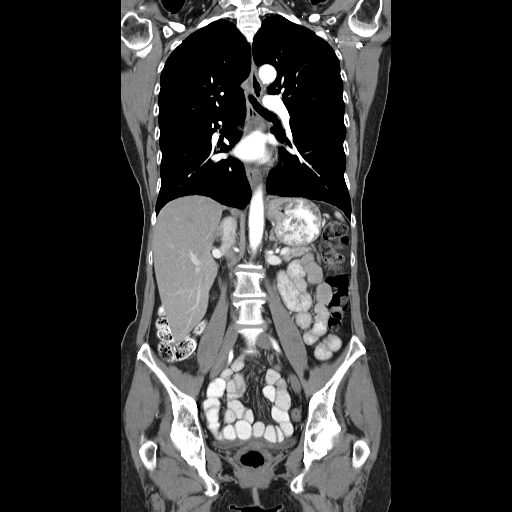

[16 of 46 positions shown; findings below may reference images not displayed]

FINDINGS: CT CHEST FINDINGS

No pathologically enlarged mediastinal, hilar, axillary or internal
mammary lymph nodes. Three-vessel coronary artery calcification.
Heart size normal. No pericardial effusion.

Mild biapical pleural parenchymal scarring. Mild centrilobular
emphysema. Scattered pulmonary nodular densities are unchanged from
05/04/2009 and are therefore considered benign. No new pulmonary
nodules. No pleural fluid. Airway is unremarkable.

CT ABDOMEN AND PELVIS FINDINGS

Liver measures approximately 18.6 cm and there is marginal
irregularity along the left hepatic lobe. There may be slightly
decreased in attenuation diffusely is well. A focal area of
hyperattenuation in the right hepatic lobe measures 9 mm (image 71
and is faintly visualized on nephrographic phase imaging.
Questionable focal area of low attenuation in the same location on
the prior examination on image 68. Tiny stone in the gallbladder.
Right adrenal gland is unremarkable. Borderline left adrenal
thickening, nonspecific. A tiny low-attenuation lesion in the
interpolar right kidney is too small to characterize. Kidneys,
spleen, pancreas, stomach and bowel are unremarkable with. Uterus is
visualized.

Atherosclerotic calcification of the arterial vasculature without
abdominal aortic aneurysm. No pathologically enlarged lymph nodes.
No worrisome lytic or sclerotic lesions. Degenerative changes are
seen in the spine.
IMPRESSION: 1. Liver is mildly enlarged and appears mildly cirrhotic. Focal
hyper attenuating lesion in the posterior right hepatic lobe is
incompletely characterized and difficult to definitively confirm as
being present on the prior exam. While a flash fill hemangioma or
perfusion anomaly can have this appearance, further evaluation with
MR abdomen without and with contrast should be considered for more
definitive characterization.
2. Otherwise, no evidence of metastatic disease.
3. Three-vessel coronary artery calcification.
4. Cholelithiasis.

## 2015-11-15 DIAGNOSIS — M545 Low back pain: Secondary | ICD-10-CM | POA: Diagnosis not present

## 2015-11-15 DIAGNOSIS — M9903 Segmental and somatic dysfunction of lumbar region: Secondary | ICD-10-CM | POA: Diagnosis not present

## 2015-11-15 DIAGNOSIS — M9902 Segmental and somatic dysfunction of thoracic region: Secondary | ICD-10-CM | POA: Diagnosis not present

## 2015-11-15 DIAGNOSIS — M542 Cervicalgia: Secondary | ICD-10-CM | POA: Diagnosis not present

## 2015-11-15 DIAGNOSIS — M546 Pain in thoracic spine: Secondary | ICD-10-CM | POA: Diagnosis not present

## 2015-11-15 DIAGNOSIS — M47812 Spondylosis without myelopathy or radiculopathy, cervical region: Secondary | ICD-10-CM | POA: Diagnosis not present

## 2015-11-15 DIAGNOSIS — M7541 Impingement syndrome of right shoulder: Secondary | ICD-10-CM | POA: Diagnosis not present

## 2015-11-15 DIAGNOSIS — M9901 Segmental and somatic dysfunction of cervical region: Secondary | ICD-10-CM | POA: Diagnosis not present

## 2015-11-15 DIAGNOSIS — M9905 Segmental and somatic dysfunction of pelvic region: Secondary | ICD-10-CM | POA: Diagnosis not present

## 2015-11-15 DIAGNOSIS — M50322 Other cervical disc degeneration at C5-C6 level: Secondary | ICD-10-CM | POA: Diagnosis not present

## 2015-11-27 DIAGNOSIS — M9903 Segmental and somatic dysfunction of lumbar region: Secondary | ICD-10-CM | POA: Diagnosis not present

## 2015-11-27 DIAGNOSIS — M50322 Other cervical disc degeneration at C5-C6 level: Secondary | ICD-10-CM | POA: Diagnosis not present

## 2015-11-27 DIAGNOSIS — M9902 Segmental and somatic dysfunction of thoracic region: Secondary | ICD-10-CM | POA: Diagnosis not present

## 2015-11-27 DIAGNOSIS — M7541 Impingement syndrome of right shoulder: Secondary | ICD-10-CM | POA: Diagnosis not present

## 2015-11-27 DIAGNOSIS — M9901 Segmental and somatic dysfunction of cervical region: Secondary | ICD-10-CM | POA: Diagnosis not present

## 2015-11-27 DIAGNOSIS — M546 Pain in thoracic spine: Secondary | ICD-10-CM | POA: Diagnosis not present

## 2015-11-27 DIAGNOSIS — M9905 Segmental and somatic dysfunction of pelvic region: Secondary | ICD-10-CM | POA: Diagnosis not present

## 2015-11-27 DIAGNOSIS — M542 Cervicalgia: Secondary | ICD-10-CM | POA: Diagnosis not present

## 2015-11-27 DIAGNOSIS — M47812 Spondylosis without myelopathy or radiculopathy, cervical region: Secondary | ICD-10-CM | POA: Diagnosis not present

## 2015-11-27 DIAGNOSIS — M545 Low back pain: Secondary | ICD-10-CM | POA: Diagnosis not present

## 2016-02-25 DIAGNOSIS — M9902 Segmental and somatic dysfunction of thoracic region: Secondary | ICD-10-CM | POA: Diagnosis not present

## 2016-02-25 DIAGNOSIS — M50322 Other cervical disc degeneration at C5-C6 level: Secondary | ICD-10-CM | POA: Diagnosis not present

## 2016-02-25 DIAGNOSIS — M9903 Segmental and somatic dysfunction of lumbar region: Secondary | ICD-10-CM | POA: Diagnosis not present

## 2016-02-25 DIAGNOSIS — M25511 Pain in right shoulder: Secondary | ICD-10-CM | POA: Diagnosis not present

## 2016-02-25 DIAGNOSIS — M9901 Segmental and somatic dysfunction of cervical region: Secondary | ICD-10-CM | POA: Diagnosis not present

## 2016-02-25 DIAGNOSIS — M47812 Spondylosis without myelopathy or radiculopathy, cervical region: Secondary | ICD-10-CM | POA: Diagnosis not present

## 2016-02-25 DIAGNOSIS — M545 Low back pain: Secondary | ICD-10-CM | POA: Diagnosis not present

## 2016-02-25 DIAGNOSIS — M542 Cervicalgia: Secondary | ICD-10-CM | POA: Diagnosis not present

## 2016-02-25 DIAGNOSIS — M546 Pain in thoracic spine: Secondary | ICD-10-CM | POA: Diagnosis not present

## 2016-02-25 DIAGNOSIS — M9905 Segmental and somatic dysfunction of pelvic region: Secondary | ICD-10-CM | POA: Diagnosis not present

## 2016-02-28 DIAGNOSIS — C50919 Malignant neoplasm of unspecified site of unspecified female breast: Secondary | ICD-10-CM | POA: Diagnosis not present

## 2016-06-02 DIAGNOSIS — M9901 Segmental and somatic dysfunction of cervical region: Secondary | ICD-10-CM | POA: Diagnosis not present

## 2016-06-02 DIAGNOSIS — M9902 Segmental and somatic dysfunction of thoracic region: Secondary | ICD-10-CM | POA: Diagnosis not present

## 2016-06-02 DIAGNOSIS — M50322 Other cervical disc degeneration at C5-C6 level: Secondary | ICD-10-CM | POA: Diagnosis not present

## 2016-06-02 DIAGNOSIS — M545 Low back pain: Secondary | ICD-10-CM | POA: Diagnosis not present

## 2016-06-02 DIAGNOSIS — M9903 Segmental and somatic dysfunction of lumbar region: Secondary | ICD-10-CM | POA: Diagnosis not present

## 2016-06-02 DIAGNOSIS — M542 Cervicalgia: Secondary | ICD-10-CM | POA: Diagnosis not present

## 2016-06-02 DIAGNOSIS — M546 Pain in thoracic spine: Secondary | ICD-10-CM | POA: Diagnosis not present

## 2016-06-02 DIAGNOSIS — M5442 Lumbago with sciatica, left side: Secondary | ICD-10-CM | POA: Diagnosis not present

## 2016-06-02 DIAGNOSIS — M9905 Segmental and somatic dysfunction of pelvic region: Secondary | ICD-10-CM | POA: Diagnosis not present

## 2016-06-02 DIAGNOSIS — M47812 Spondylosis without myelopathy or radiculopathy, cervical region: Secondary | ICD-10-CM | POA: Diagnosis not present

## 2016-06-10 DIAGNOSIS — M50322 Other cervical disc degeneration at C5-C6 level: Secondary | ICD-10-CM | POA: Diagnosis not present

## 2016-06-10 DIAGNOSIS — M47812 Spondylosis without myelopathy or radiculopathy, cervical region: Secondary | ICD-10-CM | POA: Diagnosis not present

## 2016-06-10 DIAGNOSIS — M545 Low back pain: Secondary | ICD-10-CM | POA: Diagnosis not present

## 2016-06-10 DIAGNOSIS — M9901 Segmental and somatic dysfunction of cervical region: Secondary | ICD-10-CM | POA: Diagnosis not present

## 2016-06-10 DIAGNOSIS — M9905 Segmental and somatic dysfunction of pelvic region: Secondary | ICD-10-CM | POA: Diagnosis not present

## 2016-06-10 DIAGNOSIS — M9903 Segmental and somatic dysfunction of lumbar region: Secondary | ICD-10-CM | POA: Diagnosis not present

## 2016-06-10 DIAGNOSIS — M9902 Segmental and somatic dysfunction of thoracic region: Secondary | ICD-10-CM | POA: Diagnosis not present

## 2016-06-10 DIAGNOSIS — M5442 Lumbago with sciatica, left side: Secondary | ICD-10-CM | POA: Diagnosis not present

## 2016-06-10 DIAGNOSIS — M542 Cervicalgia: Secondary | ICD-10-CM | POA: Diagnosis not present

## 2016-06-10 DIAGNOSIS — M546 Pain in thoracic spine: Secondary | ICD-10-CM | POA: Diagnosis not present

## 2016-06-16 DIAGNOSIS — M9902 Segmental and somatic dysfunction of thoracic region: Secondary | ICD-10-CM | POA: Diagnosis not present

## 2016-06-16 DIAGNOSIS — M9901 Segmental and somatic dysfunction of cervical region: Secondary | ICD-10-CM | POA: Diagnosis not present

## 2016-06-16 DIAGNOSIS — M9903 Segmental and somatic dysfunction of lumbar region: Secondary | ICD-10-CM | POA: Diagnosis not present

## 2016-06-16 DIAGNOSIS — M9905 Segmental and somatic dysfunction of pelvic region: Secondary | ICD-10-CM | POA: Diagnosis not present

## 2016-06-16 DIAGNOSIS — M50322 Other cervical disc degeneration at C5-C6 level: Secondary | ICD-10-CM | POA: Diagnosis not present

## 2016-06-16 DIAGNOSIS — M542 Cervicalgia: Secondary | ICD-10-CM | POA: Diagnosis not present

## 2016-06-16 DIAGNOSIS — M5442 Lumbago with sciatica, left side: Secondary | ICD-10-CM | POA: Diagnosis not present

## 2016-06-16 DIAGNOSIS — M546 Pain in thoracic spine: Secondary | ICD-10-CM | POA: Diagnosis not present

## 2016-06-16 DIAGNOSIS — M545 Low back pain: Secondary | ICD-10-CM | POA: Diagnosis not present

## 2016-06-16 DIAGNOSIS — M47812 Spondylosis without myelopathy or radiculopathy, cervical region: Secondary | ICD-10-CM | POA: Diagnosis not present

## 2016-06-23 DIAGNOSIS — M545 Low back pain: Secondary | ICD-10-CM | POA: Diagnosis not present

## 2016-06-23 DIAGNOSIS — M9905 Segmental and somatic dysfunction of pelvic region: Secondary | ICD-10-CM | POA: Diagnosis not present

## 2016-06-23 DIAGNOSIS — M5442 Lumbago with sciatica, left side: Secondary | ICD-10-CM | POA: Diagnosis not present

## 2016-06-23 DIAGNOSIS — M542 Cervicalgia: Secondary | ICD-10-CM | POA: Diagnosis not present

## 2016-06-23 DIAGNOSIS — M9901 Segmental and somatic dysfunction of cervical region: Secondary | ICD-10-CM | POA: Diagnosis not present

## 2016-06-23 DIAGNOSIS — M9902 Segmental and somatic dysfunction of thoracic region: Secondary | ICD-10-CM | POA: Diagnosis not present

## 2016-06-23 DIAGNOSIS — M546 Pain in thoracic spine: Secondary | ICD-10-CM | POA: Diagnosis not present

## 2016-06-23 DIAGNOSIS — M9903 Segmental and somatic dysfunction of lumbar region: Secondary | ICD-10-CM | POA: Diagnosis not present

## 2016-06-23 DIAGNOSIS — M50322 Other cervical disc degeneration at C5-C6 level: Secondary | ICD-10-CM | POA: Diagnosis not present

## 2016-06-23 DIAGNOSIS — M47812 Spondylosis without myelopathy or radiculopathy, cervical region: Secondary | ICD-10-CM | POA: Diagnosis not present

## 2016-07-02 DIAGNOSIS — M542 Cervicalgia: Secondary | ICD-10-CM | POA: Diagnosis not present

## 2016-07-02 DIAGNOSIS — M47812 Spondylosis without myelopathy or radiculopathy, cervical region: Secondary | ICD-10-CM | POA: Diagnosis not present

## 2016-07-02 DIAGNOSIS — M9901 Segmental and somatic dysfunction of cervical region: Secondary | ICD-10-CM | POA: Diagnosis not present

## 2016-07-02 DIAGNOSIS — M546 Pain in thoracic spine: Secondary | ICD-10-CM | POA: Diagnosis not present

## 2016-07-02 DIAGNOSIS — M9903 Segmental and somatic dysfunction of lumbar region: Secondary | ICD-10-CM | POA: Diagnosis not present

## 2016-07-02 DIAGNOSIS — M50322 Other cervical disc degeneration at C5-C6 level: Secondary | ICD-10-CM | POA: Diagnosis not present

## 2016-07-02 DIAGNOSIS — M5442 Lumbago with sciatica, left side: Secondary | ICD-10-CM | POA: Diagnosis not present

## 2016-07-02 DIAGNOSIS — M9902 Segmental and somatic dysfunction of thoracic region: Secondary | ICD-10-CM | POA: Diagnosis not present

## 2016-07-02 DIAGNOSIS — M545 Low back pain: Secondary | ICD-10-CM | POA: Diagnosis not present

## 2016-07-02 DIAGNOSIS — M9905 Segmental and somatic dysfunction of pelvic region: Secondary | ICD-10-CM | POA: Diagnosis not present

## 2016-07-09 DIAGNOSIS — M9902 Segmental and somatic dysfunction of thoracic region: Secondary | ICD-10-CM | POA: Diagnosis not present

## 2016-07-09 DIAGNOSIS — M546 Pain in thoracic spine: Secondary | ICD-10-CM | POA: Diagnosis not present

## 2016-07-09 DIAGNOSIS — M9905 Segmental and somatic dysfunction of pelvic region: Secondary | ICD-10-CM | POA: Diagnosis not present

## 2016-07-09 DIAGNOSIS — M542 Cervicalgia: Secondary | ICD-10-CM | POA: Diagnosis not present

## 2016-07-09 DIAGNOSIS — M50322 Other cervical disc degeneration at C5-C6 level: Secondary | ICD-10-CM | POA: Diagnosis not present

## 2016-07-09 DIAGNOSIS — M5442 Lumbago with sciatica, left side: Secondary | ICD-10-CM | POA: Diagnosis not present

## 2016-07-09 DIAGNOSIS — M47812 Spondylosis without myelopathy or radiculopathy, cervical region: Secondary | ICD-10-CM | POA: Diagnosis not present

## 2016-07-09 DIAGNOSIS — M9903 Segmental and somatic dysfunction of lumbar region: Secondary | ICD-10-CM | POA: Diagnosis not present

## 2016-07-09 DIAGNOSIS — M9901 Segmental and somatic dysfunction of cervical region: Secondary | ICD-10-CM | POA: Diagnosis not present

## 2016-07-09 DIAGNOSIS — M545 Low back pain: Secondary | ICD-10-CM | POA: Diagnosis not present

## 2016-07-14 DIAGNOSIS — R5381 Other malaise: Secondary | ICD-10-CM | POA: Diagnosis not present

## 2016-07-14 DIAGNOSIS — R358 Other polyuria: Secondary | ICD-10-CM | POA: Diagnosis not present

## 2016-07-14 DIAGNOSIS — M545 Low back pain: Secondary | ICD-10-CM | POA: Diagnosis not present

## 2016-07-14 DIAGNOSIS — R8299 Other abnormal findings in urine: Secondary | ICD-10-CM | POA: Diagnosis not present

## 2016-07-14 DIAGNOSIS — Z6822 Body mass index (BMI) 22.0-22.9, adult: Secondary | ICD-10-CM | POA: Diagnosis not present

## 2016-07-14 DIAGNOSIS — N390 Urinary tract infection, site not specified: Secondary | ICD-10-CM | POA: Diagnosis not present

## 2016-07-22 DIAGNOSIS — M50322 Other cervical disc degeneration at C5-C6 level: Secondary | ICD-10-CM | POA: Diagnosis not present

## 2016-07-22 DIAGNOSIS — M9905 Segmental and somatic dysfunction of pelvic region: Secondary | ICD-10-CM | POA: Diagnosis not present

## 2016-07-22 DIAGNOSIS — M5442 Lumbago with sciatica, left side: Secondary | ICD-10-CM | POA: Diagnosis not present

## 2016-07-22 DIAGNOSIS — M542 Cervicalgia: Secondary | ICD-10-CM | POA: Diagnosis not present

## 2016-07-22 DIAGNOSIS — M47812 Spondylosis without myelopathy or radiculopathy, cervical region: Secondary | ICD-10-CM | POA: Diagnosis not present

## 2016-07-22 DIAGNOSIS — M9903 Segmental and somatic dysfunction of lumbar region: Secondary | ICD-10-CM | POA: Diagnosis not present

## 2016-07-22 DIAGNOSIS — M9901 Segmental and somatic dysfunction of cervical region: Secondary | ICD-10-CM | POA: Diagnosis not present

## 2016-07-22 DIAGNOSIS — M546 Pain in thoracic spine: Secondary | ICD-10-CM | POA: Diagnosis not present

## 2016-07-22 DIAGNOSIS — M545 Low back pain: Secondary | ICD-10-CM | POA: Diagnosis not present

## 2016-07-22 DIAGNOSIS — M9902 Segmental and somatic dysfunction of thoracic region: Secondary | ICD-10-CM | POA: Diagnosis not present

## 2016-07-28 DIAGNOSIS — M5440 Lumbago with sciatica, unspecified side: Secondary | ICD-10-CM | POA: Diagnosis not present

## 2016-07-28 DIAGNOSIS — Z87442 Personal history of urinary calculi: Secondary | ICD-10-CM | POA: Diagnosis not present

## 2016-07-28 DIAGNOSIS — R109 Unspecified abdominal pain: Secondary | ICD-10-CM | POA: Diagnosis not present

## 2016-07-28 DIAGNOSIS — N200 Calculus of kidney: Secondary | ICD-10-CM | POA: Diagnosis not present

## 2016-07-28 DIAGNOSIS — N390 Urinary tract infection, site not specified: Secondary | ICD-10-CM | POA: Diagnosis not present

## 2016-08-29 DIAGNOSIS — E784 Other hyperlipidemia: Secondary | ICD-10-CM | POA: Diagnosis not present

## 2016-08-29 DIAGNOSIS — R8299 Other abnormal findings in urine: Secondary | ICD-10-CM | POA: Diagnosis not present

## 2016-08-29 DIAGNOSIS — N390 Urinary tract infection, site not specified: Secondary | ICD-10-CM | POA: Diagnosis not present

## 2016-08-29 DIAGNOSIS — M810 Age-related osteoporosis without current pathological fracture: Secondary | ICD-10-CM | POA: Diagnosis not present

## 2016-09-01 DIAGNOSIS — Z720 Tobacco use: Secondary | ICD-10-CM | POA: Diagnosis not present

## 2016-09-01 DIAGNOSIS — G4709 Other insomnia: Secondary | ICD-10-CM | POA: Diagnosis not present

## 2016-09-01 DIAGNOSIS — C50919 Malignant neoplasm of unspecified site of unspecified female breast: Secondary | ICD-10-CM | POA: Diagnosis not present

## 2016-09-01 DIAGNOSIS — Z0000 Encounter for general adult medical examination without abnormal findings: Secondary | ICD-10-CM | POA: Diagnosis not present

## 2016-09-01 DIAGNOSIS — M545 Low back pain: Secondary | ICD-10-CM | POA: Diagnosis not present

## 2016-09-01 DIAGNOSIS — Z1389 Encounter for screening for other disorder: Secondary | ICD-10-CM | POA: Diagnosis not present

## 2016-09-01 DIAGNOSIS — N390 Urinary tract infection, site not specified: Secondary | ICD-10-CM | POA: Diagnosis not present

## 2016-09-01 DIAGNOSIS — E784 Other hyperlipidemia: Secondary | ICD-10-CM | POA: Diagnosis not present

## 2016-09-01 DIAGNOSIS — J438 Other emphysema: Secondary | ICD-10-CM | POA: Diagnosis not present

## 2016-09-01 DIAGNOSIS — Z6823 Body mass index (BMI) 23.0-23.9, adult: Secondary | ICD-10-CM | POA: Diagnosis not present

## 2016-09-01 DIAGNOSIS — M810 Age-related osteoporosis without current pathological fracture: Secondary | ICD-10-CM | POA: Diagnosis not present

## 2016-09-01 DIAGNOSIS — F329 Major depressive disorder, single episode, unspecified: Secondary | ICD-10-CM | POA: Diagnosis not present

## 2016-10-23 DIAGNOSIS — D225 Melanocytic nevi of trunk: Secondary | ICD-10-CM | POA: Diagnosis not present

## 2016-10-23 DIAGNOSIS — L821 Other seborrheic keratosis: Secondary | ICD-10-CM | POA: Diagnosis not present

## 2016-10-23 DIAGNOSIS — H61001 Unspecified perichondritis of right external ear: Secondary | ICD-10-CM | POA: Diagnosis not present

## 2017-01-29 DIAGNOSIS — Z961 Presence of intraocular lens: Secondary | ICD-10-CM | POA: Diagnosis not present

## 2017-04-13 DIAGNOSIS — M65332 Trigger finger, left middle finger: Secondary | ICD-10-CM | POA: Diagnosis not present

## 2017-04-30 DIAGNOSIS — M65332 Trigger finger, left middle finger: Secondary | ICD-10-CM | POA: Diagnosis not present

## 2017-04-30 DIAGNOSIS — M65342 Trigger finger, left ring finger: Secondary | ICD-10-CM | POA: Diagnosis not present

## 2017-04-30 DIAGNOSIS — M19042 Primary osteoarthritis, left hand: Secondary | ICD-10-CM | POA: Diagnosis not present

## 2017-04-30 DIAGNOSIS — M1812 Unilateral primary osteoarthritis of first carpometacarpal joint, left hand: Secondary | ICD-10-CM | POA: Diagnosis not present

## 2017-05-13 DIAGNOSIS — M65332 Trigger finger, left middle finger: Secondary | ICD-10-CM | POA: Diagnosis not present

## 2017-05-13 DIAGNOSIS — M65342 Trigger finger, left ring finger: Secondary | ICD-10-CM | POA: Diagnosis not present

## 2017-05-20 DIAGNOSIS — L821 Other seborrheic keratosis: Secondary | ICD-10-CM | POA: Diagnosis not present

## 2017-05-25 DIAGNOSIS — Z720 Tobacco use: Secondary | ICD-10-CM | POA: Diagnosis not present

## 2017-05-25 DIAGNOSIS — E784 Other hyperlipidemia: Secondary | ICD-10-CM | POA: Diagnosis not present

## 2017-05-25 DIAGNOSIS — Z6823 Body mass index (BMI) 23.0-23.9, adult: Secondary | ICD-10-CM | POA: Diagnosis not present

## 2017-05-25 DIAGNOSIS — M810 Age-related osteoporosis without current pathological fracture: Secondary | ICD-10-CM | POA: Diagnosis not present

## 2017-05-25 DIAGNOSIS — C50919 Malignant neoplasm of unspecified site of unspecified female breast: Secondary | ICD-10-CM | POA: Diagnosis not present

## 2017-05-25 DIAGNOSIS — Z1389 Encounter for screening for other disorder: Secondary | ICD-10-CM | POA: Diagnosis not present

## 2017-05-25 DIAGNOSIS — M545 Low back pain: Secondary | ICD-10-CM | POA: Diagnosis not present

## 2017-05-25 DIAGNOSIS — J439 Emphysema, unspecified: Secondary | ICD-10-CM | POA: Diagnosis not present

## 2017-08-17 DIAGNOSIS — M9903 Segmental and somatic dysfunction of lumbar region: Secondary | ICD-10-CM | POA: Diagnosis not present

## 2017-08-17 DIAGNOSIS — M542 Cervicalgia: Secondary | ICD-10-CM | POA: Diagnosis not present

## 2017-08-17 DIAGNOSIS — M9901 Segmental and somatic dysfunction of cervical region: Secondary | ICD-10-CM | POA: Diagnosis not present

## 2017-08-18 DIAGNOSIS — M9901 Segmental and somatic dysfunction of cervical region: Secondary | ICD-10-CM | POA: Diagnosis not present

## 2017-08-18 DIAGNOSIS — M9903 Segmental and somatic dysfunction of lumbar region: Secondary | ICD-10-CM | POA: Diagnosis not present

## 2017-08-18 DIAGNOSIS — M542 Cervicalgia: Secondary | ICD-10-CM | POA: Diagnosis not present

## 2017-09-03 DIAGNOSIS — M9901 Segmental and somatic dysfunction of cervical region: Secondary | ICD-10-CM | POA: Diagnosis not present

## 2017-09-03 DIAGNOSIS — M9903 Segmental and somatic dysfunction of lumbar region: Secondary | ICD-10-CM | POA: Diagnosis not present

## 2017-09-03 DIAGNOSIS — M542 Cervicalgia: Secondary | ICD-10-CM | POA: Diagnosis not present

## 2017-09-09 DIAGNOSIS — M9903 Segmental and somatic dysfunction of lumbar region: Secondary | ICD-10-CM | POA: Diagnosis not present

## 2017-09-09 DIAGNOSIS — M542 Cervicalgia: Secondary | ICD-10-CM | POA: Diagnosis not present

## 2017-09-09 DIAGNOSIS — M9901 Segmental and somatic dysfunction of cervical region: Secondary | ICD-10-CM | POA: Diagnosis not present

## 2017-09-15 DIAGNOSIS — R319 Hematuria, unspecified: Secondary | ICD-10-CM | POA: Diagnosis not present

## 2017-09-15 DIAGNOSIS — F17200 Nicotine dependence, unspecified, uncomplicated: Secondary | ICD-10-CM | POA: Diagnosis not present

## 2017-09-15 DIAGNOSIS — K8020 Calculus of gallbladder without cholecystitis without obstruction: Secondary | ICD-10-CM | POA: Diagnosis not present

## 2017-09-15 DIAGNOSIS — R1031 Right lower quadrant pain: Secondary | ICD-10-CM | POA: Diagnosis not present

## 2017-09-15 DIAGNOSIS — D259 Leiomyoma of uterus, unspecified: Secondary | ICD-10-CM | POA: Diagnosis not present

## 2017-09-17 DIAGNOSIS — M542 Cervicalgia: Secondary | ICD-10-CM | POA: Diagnosis not present

## 2017-09-17 DIAGNOSIS — M9901 Segmental and somatic dysfunction of cervical region: Secondary | ICD-10-CM | POA: Diagnosis not present

## 2017-09-17 DIAGNOSIS — M9903 Segmental and somatic dysfunction of lumbar region: Secondary | ICD-10-CM | POA: Diagnosis not present

## 2017-09-24 DIAGNOSIS — M542 Cervicalgia: Secondary | ICD-10-CM | POA: Diagnosis not present

## 2017-09-24 DIAGNOSIS — M9903 Segmental and somatic dysfunction of lumbar region: Secondary | ICD-10-CM | POA: Diagnosis not present

## 2017-09-24 DIAGNOSIS — M9901 Segmental and somatic dysfunction of cervical region: Secondary | ICD-10-CM | POA: Diagnosis not present

## 2017-10-26 DIAGNOSIS — M9903 Segmental and somatic dysfunction of lumbar region: Secondary | ICD-10-CM | POA: Diagnosis not present

## 2017-10-26 DIAGNOSIS — M9901 Segmental and somatic dysfunction of cervical region: Secondary | ICD-10-CM | POA: Diagnosis not present

## 2017-10-26 DIAGNOSIS — M542 Cervicalgia: Secondary | ICD-10-CM | POA: Diagnosis not present

## 2017-11-02 DIAGNOSIS — M9903 Segmental and somatic dysfunction of lumbar region: Secondary | ICD-10-CM | POA: Diagnosis not present

## 2017-11-02 DIAGNOSIS — M9901 Segmental and somatic dysfunction of cervical region: Secondary | ICD-10-CM | POA: Diagnosis not present

## 2017-11-02 DIAGNOSIS — M542 Cervicalgia: Secondary | ICD-10-CM | POA: Diagnosis not present

## 2017-11-16 DIAGNOSIS — M9901 Segmental and somatic dysfunction of cervical region: Secondary | ICD-10-CM | POA: Diagnosis not present

## 2017-11-16 DIAGNOSIS — M542 Cervicalgia: Secondary | ICD-10-CM | POA: Diagnosis not present

## 2017-11-16 DIAGNOSIS — M9903 Segmental and somatic dysfunction of lumbar region: Secondary | ICD-10-CM | POA: Diagnosis not present

## 2017-11-25 DIAGNOSIS — Z853 Personal history of malignant neoplasm of breast: Secondary | ICD-10-CM | POA: Diagnosis not present

## 2017-11-25 DIAGNOSIS — F17210 Nicotine dependence, cigarettes, uncomplicated: Secondary | ICD-10-CM | POA: Diagnosis not present

## 2017-11-25 DIAGNOSIS — H539 Unspecified visual disturbance: Secondary | ICD-10-CM | POA: Diagnosis not present

## 2017-11-25 DIAGNOSIS — Z1283 Encounter for screening for malignant neoplasm of skin: Secondary | ICD-10-CM | POA: Diagnosis not present

## 2017-11-25 DIAGNOSIS — Z87442 Personal history of urinary calculi: Secondary | ICD-10-CM | POA: Diagnosis not present

## 2017-11-30 DIAGNOSIS — M542 Cervicalgia: Secondary | ICD-10-CM | POA: Diagnosis not present

## 2017-11-30 DIAGNOSIS — M9903 Segmental and somatic dysfunction of lumbar region: Secondary | ICD-10-CM | POA: Diagnosis not present

## 2017-11-30 DIAGNOSIS — M9901 Segmental and somatic dysfunction of cervical region: Secondary | ICD-10-CM | POA: Diagnosis not present

## 2017-12-07 DIAGNOSIS — N200 Calculus of kidney: Secondary | ICD-10-CM | POA: Diagnosis not present

## 2017-12-17 DIAGNOSIS — Z9012 Acquired absence of left breast and nipple: Secondary | ICD-10-CM | POA: Diagnosis not present

## 2017-12-17 DIAGNOSIS — Z853 Personal history of malignant neoplasm of breast: Secondary | ICD-10-CM | POA: Diagnosis not present

## 2017-12-17 DIAGNOSIS — Z87891 Personal history of nicotine dependence: Secondary | ICD-10-CM | POA: Diagnosis not present

## 2017-12-17 DIAGNOSIS — R918 Other nonspecific abnormal finding of lung field: Secondary | ICD-10-CM | POA: Diagnosis not present

## 2017-12-17 DIAGNOSIS — F17210 Nicotine dependence, cigarettes, uncomplicated: Secondary | ICD-10-CM | POA: Diagnosis not present

## 2017-12-17 DIAGNOSIS — J439 Emphysema, unspecified: Secondary | ICD-10-CM | POA: Diagnosis not present

## 2017-12-17 DIAGNOSIS — Z122 Encounter for screening for malignant neoplasm of respiratory organs: Secondary | ICD-10-CM | POA: Diagnosis not present
